# Patient Record
Sex: Female | Born: 1950
Health system: Southern US, Community
[De-identification: ages and names within clinical notes are randomized; demographics above are authoritative.]

## PROBLEM LIST (undated history)

## (undated) DIAGNOSIS — E559 Vitamin D deficiency, unspecified: Secondary | ICD-10-CM

## (undated) DIAGNOSIS — F1910 Other psychoactive substance abuse, uncomplicated: Secondary | ICD-10-CM

## (undated) DIAGNOSIS — H409 Unspecified glaucoma: Secondary | ICD-10-CM

## (undated) DIAGNOSIS — H269 Unspecified cataract: Secondary | ICD-10-CM

## (undated) DIAGNOSIS — H547 Unspecified visual loss: Secondary | ICD-10-CM

## (undated) DIAGNOSIS — T7840XA Allergy, unspecified, initial encounter: Secondary | ICD-10-CM

## (undated) DIAGNOSIS — K219 Gastro-esophageal reflux disease without esophagitis: Secondary | ICD-10-CM

## (undated) DIAGNOSIS — F418 Other specified anxiety disorders: Secondary | ICD-10-CM

## (undated) DIAGNOSIS — K5790 Diverticulosis of intestine, part unspecified, without perforation or abscess without bleeding: Secondary | ICD-10-CM

## (undated) DIAGNOSIS — F32A Depression, unspecified: Secondary | ICD-10-CM

## (undated) DIAGNOSIS — F1010 Alcohol abuse, uncomplicated: Secondary | ICD-10-CM

## (undated) DIAGNOSIS — E785 Hyperlipidemia, unspecified: Secondary | ICD-10-CM

## (undated) DIAGNOSIS — R519 Headache, unspecified: Secondary | ICD-10-CM

## (undated) HISTORY — DX: Unspecified cataract: H26.9

## (undated) HISTORY — DX: Hyperlipidemia, unspecified: E78.5

## (undated) HISTORY — DX: Diverticulosis of intestine, part unspecified, without perforation or abscess without bleeding: K57.90

## (undated) HISTORY — PX: ABDOMINAL HYSTERECTOMY: SUR658

## (undated) HISTORY — PX: ANTERIOR AND POSTERIOR VAGINAL REPAIR: SUR5

## (undated) HISTORY — DX: Unspecified visual loss: H54.7

## (undated) HISTORY — DX: Alcohol abuse, uncomplicated: F10.10

## (undated) HISTORY — DX: Other psychoactive substance abuse, uncomplicated: F19.10

## (undated) HISTORY — DX: Unspecified glaucoma: H40.9

## (undated) HISTORY — DX: Gastro-esophageal reflux disease without esophagitis: K21.9

## (undated) HISTORY — PX: EYE SURGERY: SHX253

## (undated) HISTORY — DX: Vitamin D deficiency, unspecified: E55.9

## (undated) HISTORY — PX: TUBAL LIGATION: SHX77

## (undated) HISTORY — PX: COLONOSCOPY: SHX174

## (undated) HISTORY — PX: ABDOMINAL HYSTERECTOMY: SHX81

## (undated) HISTORY — DX: Other specified anxiety disorders: F41.8

## (undated) HISTORY — DX: Allergy, unspecified, initial encounter: T78.40XA

## (undated) HISTORY — DX: Depression, unspecified: F32.A

## (undated) HISTORY — PX: HERNIA REPAIR: SHX51

## (undated) HISTORY — DX: Headache, unspecified: R51.9

---

## 2018-03-08 LAB — HM COLONOSCOPY

## 2018-08-20 DIAGNOSIS — H43811 Vitreous degeneration, right eye: Secondary | ICD-10-CM | POA: Insufficient documentation

## 2018-08-20 DIAGNOSIS — Z6823 Body mass index (BMI) 23.0-23.9, adult: Secondary | ICD-10-CM | POA: Insufficient documentation

## 2018-08-20 DIAGNOSIS — E785 Hyperlipidemia, unspecified: Secondary | ICD-10-CM | POA: Insufficient documentation

## 2018-08-20 DIAGNOSIS — E559 Vitamin D deficiency, unspecified: Secondary | ICD-10-CM | POA: Insufficient documentation

## 2019-01-03 DIAGNOSIS — R220 Localized swelling, mass and lump, head: Secondary | ICD-10-CM | POA: Insufficient documentation

## 2019-01-03 HISTORY — DX: Localized swelling, mass and lump, head: R22.0

## 2019-03-06 DIAGNOSIS — N8110 Cystocele, unspecified: Secondary | ICD-10-CM | POA: Insufficient documentation

## 2019-03-06 DIAGNOSIS — R109 Unspecified abdominal pain: Secondary | ICD-10-CM | POA: Insufficient documentation

## 2019-03-06 DIAGNOSIS — R635 Abnormal weight gain: Secondary | ICD-10-CM | POA: Insufficient documentation

## 2019-03-06 HISTORY — DX: Cystocele, unspecified: N81.10

## 2019-03-15 DIAGNOSIS — K5790 Diverticulosis of intestine, part unspecified, without perforation or abscess without bleeding: Secondary | ICD-10-CM | POA: Insufficient documentation

## 2019-04-08 DIAGNOSIS — L84 Corns and callosities: Secondary | ICD-10-CM

## 2019-04-08 DIAGNOSIS — L600 Ingrowing nail: Secondary | ICD-10-CM | POA: Insufficient documentation

## 2019-04-08 HISTORY — DX: Ingrowing nail: L60.0

## 2019-04-08 HISTORY — DX: Corns and callosities: L84

## 2019-11-25 LAB — CBC: RBC: 4.8 (ref 3.87–5.11)

## 2019-11-25 LAB — BASIC METABOLIC PANEL
BUN: 22 — AB (ref 4–21)
CO2: 23 — AB (ref 13–22)
Chloride: 106 (ref 99–108)
Creatinine: 0.7 (ref 0.5–1.1)
Glucose: 88
Potassium: 4.1 (ref 3.4–5.3)
Sodium: 141 (ref 137–147)

## 2019-11-25 LAB — CBC AND DIFFERENTIAL
HCT: 42 (ref 36–46)
Hemoglobin: 13.9 (ref 12.0–16.0)
Neutrophils Absolute: 3.3
Platelets: 229 (ref 150–399)
WBC: 6.1

## 2019-11-25 LAB — LIPID PANEL
Cholesterol: 321 — AB (ref 0–200)
HDL: 55 (ref 35–70)
LDL Cholesterol: 244
Triglycerides: 109 (ref 40–160)

## 2019-11-25 LAB — COMPREHENSIVE METABOLIC PANEL
Albumin: 4.4 (ref 3.5–5.0)
Calcium: 9.3 (ref 8.7–10.7)
GFR calc non Af Amer: 90.6

## 2020-06-09 ENCOUNTER — Encounter: Payer: Self-pay | Admitting: Family Medicine

## 2020-06-11 ENCOUNTER — Ambulatory Visit: Payer: Self-pay | Admitting: Family Medicine

## 2020-06-12 ENCOUNTER — Encounter: Payer: Self-pay | Admitting: Family Medicine

## 2020-06-12 ENCOUNTER — Other Ambulatory Visit: Payer: Self-pay

## 2020-06-12 ENCOUNTER — Ambulatory Visit (INDEPENDENT_AMBULATORY_CARE_PROVIDER_SITE_OTHER): Payer: Medicare Other | Admitting: Family Medicine

## 2020-06-12 VITALS — BP 100/63 | HR 71 | Temp 98.6°F | Ht 61.25 in | Wt 138.0 lb

## 2020-06-12 DIAGNOSIS — Z7689 Persons encountering health services in other specified circumstances: Secondary | ICD-10-CM

## 2020-06-12 DIAGNOSIS — E559 Vitamin D deficiency, unspecified: Secondary | ICD-10-CM | POA: Diagnosis not present

## 2020-06-12 DIAGNOSIS — B380 Acute pulmonary coccidioidomycosis: Secondary | ICD-10-CM | POA: Diagnosis not present

## 2020-06-12 DIAGNOSIS — Z1231 Encounter for screening mammogram for malignant neoplasm of breast: Secondary | ICD-10-CM

## 2020-06-12 DIAGNOSIS — Z23 Encounter for immunization: Secondary | ICD-10-CM | POA: Diagnosis not present

## 2020-06-12 DIAGNOSIS — E2839 Other primary ovarian failure: Secondary | ICD-10-CM

## 2020-06-12 MED ORDER — PROMETHAZINE-CODEINE 6.25-10 MG/5ML PO SYRP: 5.0000 mL | ORAL_SOLUTION | Freq: Three times a day (TID) | ORAL | 0 refills | Status: DC | PRN

## 2020-06-12 MED ORDER — ALBUTEROL SULFATE HFA 108 (90 BASE) MCG/ACT IN AERS
1.0000 | INHALATION_SPRAY | Freq: Four times a day (QID) | RESPIRATORY_TRACT | 1 refills | Status: DC | PRN
Start: 2020-06-12 — End: 2021-01-20

## 2020-06-12 NOTE — Patient Instructions (Addendum)
Pleasure to meet you today.  I have referred you to pulmonology.  I also placed your order for mammogram and bone density.   Follow up In May for your yearly physical- it was completed 11/27/2019 this year per your records.   I will refill your cough syrup for you. Future refills from pulmonology.
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
## 2020-07-22 ENCOUNTER — Telehealth: Payer: Self-pay | Admitting: Family Medicine

## 2020-07-22 NOTE — Telephone Encounter (Signed)
Please advise 

## 2020-07-22 NOTE — Telephone Encounter (Signed)
Patient still having cough from [facility] Fever and requesting refill of Phenergan w/Codeine cough syrup.

## 2020-07-23 MED ORDER — PROMETHAZINE-CODEINE 6.25-10 MG/5ML PO SYRP: 5.0000 mL | ORAL_SOLUTION | Freq: Three times a day (TID) | ORAL | 0 refills | Status: DC | PRN

## 2020-07-23 NOTE — Telephone Encounter (Signed)
Please advise, is it okay to direct pt to urgent care?

## 2020-07-23 NOTE — Telephone Encounter (Signed)
Patient following up with her request for a refill of cough meds.  She stated she has not heard back anyone. Patient reports she is very sick, and cough kept her up all night. She stated that she requested an appt to be seen yesterday, however, I could not find in telephone encounter from yesterday.  Patient asked to see her PCP, Dr. Claiborne Billings, when I told her that she did not have any appts, she became frustrated and started to cry. I told her I was sorry that she felt bad.  She felt as someone needed to listen to her lungs instead of a virtual appt.  I told patient I would send another message back to Dr. Claiborne Billings and her clinical staff and I told her I was sending back as a high priority request.  please call patient at [phone]. Thank you!

## 2020-07-23 NOTE — Telephone Encounter (Signed)
Patient advised and voiced understanding.  

## 2020-07-23 NOTE — Telephone Encounter (Signed)
I am sorry she is not feeling well.  Patient asked for a refill on a cough syrup with codeine less than 24 hours ago.   Patient should be reminded of our refill policy to ensure appropriate expectations in the future and to avoid her becoming frustrated.  I would encourage her to go to the urgent care if she is not feeling well-especially since we are going into a holiday weekend and offices will be closed.   I will refill her promethazine-codeine cough syrup for her today.  However in the future, if this medication is necessary it should really be approved by her new pulmonology team, once she is established with them.

## 2020-07-30 DIAGNOSIS — R6889 Other general symptoms and signs: Secondary | ICD-10-CM | POA: Diagnosis not present

## 2020-08-17 ENCOUNTER — Other Ambulatory Visit: Payer: Self-pay | Admitting: Family Medicine

## 2020-08-17 DIAGNOSIS — Z1231 Encounter for screening mammogram for malignant neoplasm of breast: Secondary | ICD-10-CM

## 2020-08-17 DIAGNOSIS — E559 Vitamin D deficiency, unspecified: Secondary | ICD-10-CM

## 2020-08-17 DIAGNOSIS — E2839 Other primary ovarian failure: Secondary | ICD-10-CM

## 2020-08-18 ENCOUNTER — Other Ambulatory Visit: Payer: Self-pay

## 2020-08-18 ENCOUNTER — Telehealth: Payer: Self-pay | Admitting: Family Medicine

## 2020-08-18 ENCOUNTER — Ambulatory Visit
Admission: RE | Admit: 2020-08-18 | Discharge: 2020-08-18 | Disposition: A | Discharge status: Home / Self Care | Payer: Medicare Other | Source: Ambulatory Visit | Attending: Family Medicine | Admitting: Family Medicine

## 2020-08-18 DIAGNOSIS — Z780 Asymptomatic menopausal state: Secondary | ICD-10-CM | POA: Diagnosis not present

## 2020-08-18 DIAGNOSIS — M810 Age-related osteoporosis without current pathological fracture: Secondary | ICD-10-CM | POA: Diagnosis not present

## 2020-08-18 DIAGNOSIS — E2839 Other primary ovarian failure: Secondary | ICD-10-CM

## 2020-08-18 DIAGNOSIS — B380 Acute pulmonary coccidioidomycosis: Secondary | ICD-10-CM | POA: Insufficient documentation

## 2020-08-18 DIAGNOSIS — M816 Localized osteoporosis [Lequesne]: Secondary | ICD-10-CM

## 2020-08-18 DIAGNOSIS — M85851 Other specified disorders of bone density and structure, right thigh: Secondary | ICD-10-CM | POA: Diagnosis not present

## 2020-08-18 NOTE — Telephone Encounter (Signed)
Please inform patient the following information: Her bone density scan showed osteopenia-low bone density overall.   However, her right forearm showed a rather significant decrease in bone density compared to the rest of the imaging sites and is osteoporotic in this location.  -Has she ever had any injury to her right forearm?  Sometimes we see such a large difference it is because patient had a prior injury.  We need to discuss osteoporosis treatment and also check her vitamin D levels.  Please schedule her for provider follow-up within the next 4 weeks to further discuss
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
## 2020-08-20 ENCOUNTER — Other Ambulatory Visit: Payer: Self-pay

## 2020-08-20 ENCOUNTER — Telehealth: Payer: Self-pay

## 2020-08-20 ENCOUNTER — Ambulatory Visit
Admission: RE | Admit: 2020-08-20 | Discharge: 2020-08-20 | Disposition: A | Discharge status: Home / Self Care | Payer: Medicare Other | Source: Ambulatory Visit | Attending: Family Medicine | Admitting: Family Medicine

## 2020-08-20 DIAGNOSIS — Z1231 Encounter for screening mammogram for malignant neoplasm of breast: Secondary | ICD-10-CM
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
## 2020-08-20 NOTE — Telephone Encounter (Signed)
Please read the message below. And advise if needed

## 2020-08-21 NOTE — Telephone Encounter (Signed)
Left VM for pt 

## 2020-08-21 NOTE — Telephone Encounter (Signed)
Pt sees Dr Melvyn Novas on 09/21/20. Appt was made yesterday.

## 2020-08-21 NOTE — Telephone Encounter (Signed)
Patient asking what she can do for her cough until her appointment next week.  She can use an over-the-counter cough syrup and cough drops,until we can see her to refill her cough medicine which is a controlled substance in the state of [place]. There are no other recommendations, unfortunately

## 2020-08-26 ENCOUNTER — Ambulatory Visit: Payer: Medicare Other | Admitting: Family Medicine

## 2020-08-31 DIAGNOSIS — B382 Pulmonary coccidioidomycosis, unspecified: Secondary | ICD-10-CM | POA: Diagnosis not present

## 2020-08-31 DIAGNOSIS — R911 Solitary pulmonary nodule: Secondary | ICD-10-CM | POA: Diagnosis not present

## 2020-09-21 ENCOUNTER — Institutional Professional Consult (permissible substitution): Payer: Medicare Other | Admitting: Internal Medicine

## 2020-09-29 ENCOUNTER — Ambulatory Visit: Payer: Medicare Other

## 2020-11-02 DIAGNOSIS — F1011 Alcohol abuse, in remission: Secondary | ICD-10-CM | POA: Insufficient documentation

## 2020-11-02 DIAGNOSIS — Z87891 Personal history of nicotine dependence: Secondary | ICD-10-CM | POA: Insufficient documentation

## 2020-11-30 ENCOUNTER — Encounter: Payer: Medicare Other | Admitting: Family Medicine

## 2020-12-23 ENCOUNTER — Encounter: Payer: Medicare Other | Admitting: Family Medicine

## 2020-12-28 ENCOUNTER — Telehealth: Payer: Self-pay

## 2020-12-28 DIAGNOSIS — Z20822 Contact with and (suspected) exposure to covid-19: Secondary | ICD-10-CM

## 2020-12-28 NOTE — Telephone Encounter (Signed)
Pt scheduled, 12/29/20, to have VV for covid exposure and is starting to have symptoms. Pt anted to know how she would find out if she had COVID. If you would like for pt to have swabs done tomorrow I can call her and have her come in before her 4pm appt.

## 2020-12-28 NOTE — Telephone Encounter (Signed)
Yes, please call her back and set her up for COVID-19 testing today if possible.  And we will complete her video visit tomorrow at scheduled time.  Thanks.

## 2020-12-28 NOTE — Telephone Encounter (Signed)
LM for pt to return call to discuss.  

## 2020-12-29 ENCOUNTER — Encounter: Payer: Self-pay | Admitting: Family Medicine

## 2020-12-29 ENCOUNTER — Ambulatory Visit: Payer: Medicare Other

## 2020-12-29 ENCOUNTER — Telehealth (INDEPENDENT_AMBULATORY_CARE_PROVIDER_SITE_OTHER): Payer: Medicare Other | Admitting: Family Medicine

## 2020-12-29 ENCOUNTER — Other Ambulatory Visit: Payer: Self-pay

## 2020-12-29 VITALS — Temp 99.0°F

## 2020-12-29 DIAGNOSIS — Z20822 Contact with and (suspected) exposure to covid-19: Secondary | ICD-10-CM

## 2020-12-29 DIAGNOSIS — R058 Other specified cough: Secondary | ICD-10-CM | POA: Diagnosis not present

## 2020-12-29 MED ORDER — PREDNISONE 20 MG PO TABS: ORAL_TABLET | ORAL | 0 refills | Status: DC

## 2020-12-29 NOTE — Telephone Encounter (Signed)
Order placed

## 2020-12-29 NOTE — Telephone Encounter (Signed)
Yes, please call her back and set her up for COVID-19 testing today if possible.. Any other questions will be answered at appt

## 2020-12-29 NOTE — Addendum Note (Signed)
Addended by: Octaviano Glow on: 12/29/2020 11:15 AM   Modules accepted: Orders

## 2020-12-29 NOTE — Telephone Encounter (Signed)
Are we doing just COVID or are we doing flu and strep also?

## 2020-12-29 NOTE — Telephone Encounter (Signed)
COVId only

## 2020-12-29 NOTE — Telephone Encounter (Signed)
LM for pt to return call to discuss.  

## 2020-12-29 NOTE — Progress Notes (Signed)
VIRTUAL VISIT VIA VIDEO  I connected with Crystal Horn on 12/30/20 at  4:00 PM EDT by elemedicine application and verified that I am speaking with the correct person using two identifiers. Location patient: Home Location provider: [facility], Office Persons participating in the virtual visit: Patient, Dr. Raoul Pitch and Darnell Level. Cesar, CMA  I discussed the limitations of evaluation and management by telemedicine and the availability of in person appointments. The patient expressed understanding and agreed to proceed.   SUBJECTIVE Chief Complaint  Patient presents with  . Covid Exposure    Pt c/o diarrhea, nasal congestion, cough, chills, sweats x 2 days; pt was exposed 2 weeks ago at home; pt home covid test was neg    HPI: Crystal Horn is a 70 y.o. female present for covid exposure.  Exposure: 2 weeks ago at home- her daughter was positive.  Sx start: 2 days ago Vaccine:moderna x3 (Last > 6 mos) Pt endorses diarrhea, headache, decrease appetite, cough, chills, sweats. Pt denies shortness of breath Otc: advil, albuterol GFR: >60 Anticoag: N/A She has a h/o [facility] fever. She has not needed any cough syrup  ROS: See pertinent positives and negatives per HPI.  Patient Active Problem List   Diagnosis Date Noted  . Former cigarette smoker 11/02/2020  . History of alcohol abuse 11/02/2020  . [facility] fever ([place] 08/18/2020  . Localized osteoporosis without current pathological fracture 08/18/2020  . Hyperlipidemia 08/20/2018  . Vitamin D deficiency 08/20/2018  . Vitreous degeneration and detachment of right eye 08/20/2018    Social History   Tobacco Use  . Smoking status: Former Research scientist (life sciences)  . Smokeless tobacco: Never Used  Substance Use Topics  . Alcohol use: Not Currently    Comment: former drinker    Current Outpatient Medications:  .  albuterol (VENTOLIN HFA) 108 (90 Base) MCG/ACT inhaler, Inhale 1-2 puffs into the lungs every 6 (six) hours as needed for  wheezing or shortness of breath., Disp: 6.7 each, Rfl: 1 .  LORazepam (ATIVAN) 0.5 MG tablet, Take by mouth., Disp: , Rfl:  .  omeprazole (PRILOSEC) 20 MG capsule, Take 20 mg by mouth daily., Disp: , Rfl:  .  predniSONE (DELTASONE) 20 MG tablet, 60 mg x3d, 40 mg x3d, 20 mg x2d, 10 mg x2d, Disp: 18 tablet, Rfl: 0  Allergies  Allergen Reactions  . Sulfa Antibiotics Rash    OBJECTIVE: Temp 99 F (37.2 C) (Oral)  Gen: No acute distress. Nontoxic in appearance.  HENT: AT. [place].  MMM.  Eyes:Pupils Equal Round Reactive to light, Extraocular movements intact,  Conjunctiva without redness, discharge or icterus. Chest: Cough present- no shortness of breath Neuro: Normal gait. Alert. Oriented x3  Psych: Normal affect and demeanor. Normal speech. Normal thought content and judgment.  ASSESSMENT AND PLAN: Crystal Horn is a 70 y.o. female present for  Cough with exposure to COVID-19 virus Rest, hydrate.  +/- flonase, mucinex (DM if cough), nettie pot or nasal saline.  Prednisone taper prescribed. Mucinex DM/Delsym encouraged. COVID test is pending. Self-isolation encouraged If positive COVID would prescribe paxlovid for her.  She is agreeable to this approach but would like to wait until our results are available. Emergent precautions discussed F/U 2 weeks if not improved.     Howard Pouch, DO 12/30/2020   No follow-ups on file.  No orders of the defined types were placed in this encounter.  Meds ordered this encounter  Medications  . predniSONE (DELTASONE) 20 MG tablet    Sig: 60  mg x3d, 40 mg x3d, 20 mg x2d, 10 mg x2d    Dispense:  18 tablet    Refill:  0   Referral Orders  No referral(s) requested today

## 2020-12-29 NOTE — Telephone Encounter (Signed)
Spoke with patient regarding results/recommendations. Scheduled at 1 pm

## 2020-12-29 NOTE — Telephone Encounter (Signed)
Pt states that she did a home COVID test and it was negative. Any suggestions/recommendations needed before appt?

## 2020-12-30 ENCOUNTER — Other Ambulatory Visit: Payer: Self-pay | Admitting: Family Medicine

## 2020-12-30 LAB — SARS-COV-2, NAA 2 DAY TAT

## 2020-12-30 LAB — NOVEL CORONAVIRUS, NAA: SARS-CoV-2, NAA: NOT DETECTED

## 2020-12-30 MED ORDER — AZITHROMYCIN 250 MG PO TABS: ORAL_TABLET | ORAL | 0 refills | Status: AC

## 2021-01-06 ENCOUNTER — Encounter: Payer: Medicare Other | Admitting: Family Medicine

## 2021-01-15 ENCOUNTER — Encounter: Payer: Medicare Other | Admitting: Family Medicine

## 2021-01-20 ENCOUNTER — Encounter: Payer: Self-pay | Admitting: Family Medicine

## 2021-01-20 ENCOUNTER — Other Ambulatory Visit: Payer: Self-pay

## 2021-01-20 ENCOUNTER — Ambulatory Visit (INDEPENDENT_AMBULATORY_CARE_PROVIDER_SITE_OTHER): Payer: Medicare Other | Admitting: Family Medicine

## 2021-01-20 VITALS — BP 107/69 | HR 73 | Temp 98.6°F | Ht 61.0 in | Wt 134.0 lb

## 2021-01-20 DIAGNOSIS — B380 Acute pulmonary coccidioidomycosis: Secondary | ICD-10-CM

## 2021-01-20 DIAGNOSIS — M816 Localized osteoporosis [Lequesne]: Secondary | ICD-10-CM

## 2021-01-20 DIAGNOSIS — Z1159 Encounter for screening for other viral diseases: Secondary | ICD-10-CM | POA: Diagnosis not present

## 2021-01-20 DIAGNOSIS — E782 Mixed hyperlipidemia: Secondary | ICD-10-CM

## 2021-01-20 DIAGNOSIS — K219 Gastro-esophageal reflux disease without esophagitis: Secondary | ICD-10-CM | POA: Insufficient documentation

## 2021-01-20 DIAGNOSIS — Z23 Encounter for immunization: Secondary | ICD-10-CM | POA: Diagnosis not present

## 2021-01-20 DIAGNOSIS — E559 Vitamin D deficiency, unspecified: Secondary | ICD-10-CM | POA: Diagnosis not present

## 2021-01-20 DIAGNOSIS — Z1211 Encounter for screening for malignant neoplasm of colon: Secondary | ICD-10-CM

## 2021-01-20 DIAGNOSIS — Z0000 Encounter for general adult medical examination without abnormal findings: Secondary | ICD-10-CM

## 2021-01-20 DIAGNOSIS — M810 Age-related osteoporosis without current pathological fracture: Secondary | ICD-10-CM | POA: Diagnosis not present

## 2021-01-20 DIAGNOSIS — Z131 Encounter for screening for diabetes mellitus: Secondary | ICD-10-CM | POA: Diagnosis not present

## 2021-01-20 DIAGNOSIS — F419 Anxiety disorder, unspecified: Secondary | ICD-10-CM | POA: Insufficient documentation

## 2021-01-20 LAB — COMPREHENSIVE METABOLIC PANEL WITH GFR
ALT: 10 U/L (ref 0–35)
AST: 13 U/L (ref 0–37)
Albumin: 4.3 g/dL (ref 3.5–5.2)
Alkaline Phosphatase: 65 U/L (ref 39–117)
BUN: 22 mg/dL (ref 6–23)
CO2: 26 meq/L (ref 19–32)
Calcium: 9.6 mg/dL (ref 8.4–10.5)
Chloride: 105 meq/L (ref 96–112)
Creatinine, Ser: 0.66 mg/dL (ref 0.40–1.20)
GFR: 90.34 mL/min (ref >60.00)
Glucose, Bld: 88 mg/dL (ref 70–99)
Potassium: 3.9 meq/L (ref 3.5–5.1)
Sodium: 139 meq/L (ref 135–145)
Total Bilirubin: 0.6 mg/dL (ref 0.2–1.2)
Total Protein: 6.6 g/dL (ref 6.0–8.3)

## 2021-01-20 LAB — CBC WITH DIFFERENTIAL/PLATELET
Basophils Absolute: 0 K/uL (ref 0.0–0.1)
Basophils Relative: 0.8 % (ref 0.0–3.0)
Eosinophils Absolute: 0.1 K/uL (ref 0.0–0.7)
Eosinophils Relative: 1.4 % (ref 0.0–5.0)
HCT: 39.8 % (ref 36.0–46.0)
Hemoglobin: 13.8 g/dL (ref 12.0–15.0)
Lymphocytes Relative: 29.8 % (ref 12.0–46.0)
Lymphs Abs: 1.6 K/uL (ref 0.7–4.0)
MCHC: 34.7 g/dL (ref 30.0–36.0)
MCV: 87 fl (ref 78.0–100.0)
Monocytes Absolute: 0.5 K/uL (ref 0.1–1.0)
Monocytes Relative: 8.6 % (ref 3.0–12.0)
Neutro Abs: 3.3 K/uL (ref 1.4–7.7)
Neutrophils Relative %: 59.4 % (ref 43.0–77.0)
Platelets: 188 K/uL (ref 150.0–400.0)
RBC: 4.58 Mil/uL (ref 3.87–5.11)
RDW: 13.1 % (ref 11.5–15.5)
WBC: 5.5 K/uL (ref 4.0–10.5)

## 2021-01-20 LAB — LIPID PANEL
Cholesterol: 302 mg/dL — ABNORMAL HIGH (ref 0–200)
HDL: 58.5 mg/dL (ref >39.00)
NonHDL: 243.88
Total CHOL/HDL Ratio: 5
Triglycerides: 202 mg/dL — ABNORMAL HIGH (ref 0.0–149.0)
VLDL: 40.4 mg/dL — ABNORMAL HIGH (ref 0.0–40.0)

## 2021-01-20 LAB — HEMOGLOBIN A1C: Hgb A1c MFr Bld: 6.1 % (ref 4.6–6.5)

## 2021-01-20 LAB — LDL CHOLESTEROL, DIRECT: Direct LDL: 208 mg/dL

## 2021-01-20 LAB — VITAMIN D 25 HYDROXY (VIT D DEFICIENCY, FRACTURES): VITD: 24.4 ng/mL — ABNORMAL LOW (ref 30.00–100.00)

## 2021-01-20 LAB — TSH: TSH: 1.2 u[IU]/mL (ref 0.35–4.50)

## 2021-01-20 MED ORDER — TETANUS-DIPHTH-ACELL PERTUSSIS 5-2.5-18.5 LF-MCG/0.5 IM SUSP: 0.5000 mL | Freq: Once | INTRAMUSCULAR | 0 refills | Status: AC

## 2021-01-20 MED ORDER — OMEPRAZOLE 20 MG PO CPDR: 20.0000 mg | DELAYED_RELEASE_CAPSULE | Freq: Every day | ORAL | 1 refills | Status: DC

## 2021-01-20 MED ORDER — ALENDRONATE SODIUM 70 MG PO TABS: 70.0000 mg | ORAL_TABLET | ORAL | 11 refills | Status: DC

## 2021-01-20 MED ORDER — LORAZEPAM 0.5 MG PO TABS: 0.5000 mg | ORAL_TABLET | Freq: Every day | ORAL | 1 refills | Status: DC | PRN

## 2021-01-20 MED ORDER — SHINGRIX 50 MCG/0.5ML IM SUSR: 0.5000 mL | Freq: Once | INTRAMUSCULAR | 0 refills | Status: DC

## 2021-01-20 MED ORDER — TETANUS-DIPHTH-ACELL PERTUSSIS 5-2.5-18.5 LF-MCG/0.5 IM SUSP
0.5000 mL | Freq: Once | INTRAMUSCULAR | 0 refills | Status: DC
Start: 2021-01-20 — End: 2021-01-20

## 2021-01-20 MED ORDER — SHINGRIX 50 MCG/0.5ML IM SUSR: 0.5000 mL | Freq: Once | INTRAMUSCULAR | 0 refills | Status: AC

## 2021-01-20 MED ORDER — ALBUTEROL SULFATE HFA 108 (90 BASE) MCG/ACT IN AERS: 1.0000 | INHALATION_SPRAY | Freq: Four times a day (QID) | RESPIRATORY_TRACT | 5 refills | Status: DC | PRN
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
## 2021-01-20 NOTE — Patient Instructions (Addendum)
Start fosamax 1 tab once weekly.  Calcium 1200 mg a day-   Health Maintenance After Age 68 After age 66, you are at a higher risk for certain long-term diseases and infections as well as injuries from falls. Falls are a major cause of broken bones and head injuries in people who are older than age 22. Getting regular preventive care can help to keep you healthy and well. Preventive care includes getting regular testing and making lifestyle changes as recommended by your health care provider. Talk with your health care provider about: Which screenings and tests you should have. A screening is a test that checks for a disease when you have no symptoms. A diet and exercise plan that is right for you. What should I know about screenings and tests to prevent falls? Screening and testing are the best ways to find a health problem early. Early diagnosis and treatment give you the best chance of managing medical conditions that are common after age 37. Certain conditions and lifestyle choices may make you more likely to have a fall. Your health care provider may recommend: Regular vision checks. Poor vision and conditions such as cataracts can make you more likely to have a fall. If you wear glasses, make sure to get your prescription updated if your vision changes. Medicine review. Work with your health care provider to regularly review all of the medicines you are taking, including over-the-counter medicines. Ask your health care provider about any side effects that may make you more likely to have a fall. Tell your health care provider if any medicines that you take make you feel dizzy or sleepy. Osteoporosis screening. Osteoporosis is a condition that causes the bones to get weaker. This can make the bones weak and cause them to break more easily. Blood pressure screening. Blood pressure changes and medicines to control blood pressure can make you feel dizzy. Strength and balance checks. Your health care  provider may recommend certain tests to check your strength and balance while standing, walking, or changing positions. Foot health exam. Foot pain and numbness, as well as not wearing proper footwear, can make you more likely to have a fall. Depression screening. You may be more likely to have a fall if you have a fear of falling, feel emotionally low, or feel unable to do activities that you used to do. Alcohol use screening. Using too much alcohol can affect your balance and may make you more likely to have a fall. What actions can I take to lower my risk of falls? General instructions Talk with your health care provider about your risks for falling. Tell your health care provider if: You fall. Be sure to tell your health care provider about all falls, even ones that seem minor. You feel dizzy, sleepy, or off-balance. Take over-the-counter and prescription medicines only as told by your health care provider. These include any supplements. Eat a healthy diet and maintain a healthy weight. A healthy diet includes low-fat dairy products, low-fat (lean) meats, and fiber from whole grains, beans, and lots of fruits and vegetables. Home safety Remove any tripping hazards, such as rugs, cords, and clutter. Install safety equipment such as grab bars in bathrooms and safety rails on stairs. Keep rooms and walkways well-lit. Activity  Follow a regular exercise program to stay fit. This will help you maintain your balance. Ask your health care provider what types of exercise are appropriate for you. If you need a cane or walker, use it as recommended by  your health care provider. Wear supportive shoes that have nonskid soles.  Lifestyle Do not drink alcohol if your health care provider tells you not to drink. If you drink alcohol, limit how much you have: 0-1 drink a day for women. 0-2 drinks a day for men. Be aware of how much alcohol is in your drink. In the [place] one drink equals one typical  bottle of beer (12 oz), one-half glass of wine (5 oz), or one shot of hard liquor (1 oz). Do not use any products that contain nicotine or tobacco, such as cigarettes and e-cigarettes. If you need help quitting, ask your health care provider. Summary Having a healthy lifestyle and getting preventive care can help to protect your health and wellness after age 24. Screening and testing are the best way to find a health problem early and help you avoid having a fall. Early diagnosis and treatment give you the best chance for managing medical conditions that are more common for people who are older than age 29. Falls are a major cause of broken bones and head injuries in people who are older than age 69. Take precautions to prevent a fall at home. Work with your health care provider to learn what changes you can make to improve your health and wellness and to prevent falls. This information is not intended to replace advice given to you by your health care provider. Make sure you discuss any questions you have with your healthcare provider. Document Revised: 06/26/2020 Document Reviewed: 06/26/2020 Elsevier Patient Education  2022 [employer].

## 2021-01-21 ENCOUNTER — Telehealth: Payer: Self-pay | Admitting: Family Medicine

## 2021-01-21 DIAGNOSIS — Z79899 Other long term (current) drug therapy: Secondary | ICD-10-CM

## 2021-01-21 DIAGNOSIS — Z8249 Family history of ischemic heart disease and other diseases of the circulatory system: Secondary | ICD-10-CM

## 2021-01-21 DIAGNOSIS — R7303 Prediabetes: Secondary | ICD-10-CM

## 2021-01-21 HISTORY — DX: Prediabetes: R73.03

## 2021-01-21 LAB — HEPATITIS C ANTIBODY
Hepatitis C Ab: NONREACTIVE
SIGNAL TO CUT-OFF: 0 (ref <1.00)

## 2021-01-21 MED ORDER — ATORVASTATIN CALCIUM 20 MG PO TABS: 20.0000 mg | ORAL_TABLET | Freq: Every evening | ORAL | 3 refills | Status: DC

## 2021-01-21 NOTE — Telephone Encounter (Signed)
Please call patient Liver, kidney and thyroid function are normal Blood cell counts and electrolytes are normal Diabetes screening/A1c is elevated in the "prediabetic "range at 6.1.  See recommendations below Vitamin D is mildly low, I would encourage her to increase her vitamin D supplement by 1000 units daily OTC. Cholesterol panel is extremely high.   -Elevated triglycerides of 202, normal is less than 150   -Elevated LDL/bad cholesterol to 208, goal is at least less than 130. -This puts her at a severely higher risk of heart attack or stroke than the normal population-especially with her family history of heart disease.   -Recommendations from the [facility] is that she start a medication to help lower her cholesterol and also provide her extra cardiovascular protection from heart attack and stroke.  First-line therapy is a medication in the statin family group.  I have called this medicine in for her to start 1 tab before bed.   -Follow-up in 2 months with provider.  Come fasting to that appointment.  We will be rechecking her cholesterol and we also always check the liver enzymes after starting this medication.  She should also increase her exercise to be routinely at least 150 minutes a week of cardiovascular exercise, along with a low saturated fat diet.  Avoiding butters, red meats, fatty or cuts of meats, sugary snacks/drinks and saturated fats.  Diet should have higher fiber content, fresh fruits vegetables and lean meats.

## 2021-01-21 NOTE — Telephone Encounter (Signed)
Spoke with pt regarding labs and instructions.   

## 2021-02-02 ENCOUNTER — Telehealth (INDEPENDENT_AMBULATORY_CARE_PROVIDER_SITE_OTHER): Payer: Medicare Other | Admitting: Family Medicine

## 2021-02-02 ENCOUNTER — Encounter: Payer: Self-pay | Admitting: Family Medicine

## 2021-02-02 ENCOUNTER — Telehealth: Payer: Self-pay

## 2021-02-02 DIAGNOSIS — R059 Cough, unspecified: Secondary | ICD-10-CM

## 2021-02-02 DIAGNOSIS — U071 COVID-19: Secondary | ICD-10-CM | POA: Diagnosis not present

## 2021-02-02 DIAGNOSIS — B380 Acute pulmonary coccidioidomycosis: Secondary | ICD-10-CM

## 2021-02-02 MED ORDER — NIRMATRELVIR/RITONAVIR (PAXLOVID)TABLET: 3.0000 | ORAL_TABLET | Freq: Two times a day (BID) | ORAL | 0 refills | Status: AC

## 2021-02-02 MED ORDER — HYDROCODONE BIT-HOMATROP MBR 5-1.5 MG/5ML PO SOLN: 5.0000 mL | Freq: Three times a day (TID) | ORAL | 0 refills | Status: DC | PRN

## 2021-02-02 MED ORDER — PREDNISONE 20 MG PO TABS: ORAL_TABLET | ORAL | 0 refills | Status: DC

## 2021-02-02 NOTE — Progress Notes (Signed)
VIRTUAL VISIT VIA VIDEO  I connected with Crystal Horn on 02/02/21 at 11:30 AM EDT by elemedicine application and verified that I am speaking with the correct person using two identifiers. Location patient: Home Location provider: [facility], Office Persons participating in the virtual visit: Patient, Dr. Raoul Pitch and Darnell Level. Cesar, CMA  I discussed the limitations of evaluation and management by telemedicine and the availability of in person appointments. The patient expressed understanding and agreed to proceed.   SUBJECTIVE Chief Complaint  Patient presents with   Covid Positive    Pt c/o cough, sore throat, nasal congestion, HA, fatigue, eye drainage, low grade temp 99.5 x 1 day; pt home covid pos last night    HPI: Crystal Horn is a 70 y.o. female present for covid + illness.  Exposure:Family Reunion over the weekend.  Sx start: x1 day of cough, sore throat, nasal congestion, headache, fatigue, drainage from her eyes and low grade fever. Positive test yesterday.  Vaccine:Moderna x3 Pt denies shortness breath,  Otc: advil.  GFR: >90 Anticoag: N/a   ROS: See pertinent positives and negatives per HPI.  Patient Active Problem List   Diagnosis Date Noted   Family history of heart disease 01/21/2021   Prediabetes 01/21/2021   GERD (gastroesophageal reflux disease) 01/20/2021   Osteoporosis without current pathological fracture 01/20/2021   Anxiety 01/20/2021   Former cigarette smoker 11/02/2020   History of alcohol abuse 11/02/2020   [facility] fever [facility]) 08/18/2020   Localized osteoporosis without current pathological fracture 08/18/2020   Hyperlipidemia 08/20/2018   Vitamin D deficiency 08/20/2018   Vitreous degeneration and detachment of right eye 08/20/2018    Social History   Tobacco Use   Smoking status: Former    Pack years: 0.00   Smokeless tobacco: Never  Substance Use Topics   Alcohol use: Not Currently    Comment: former drinker     Current Outpatient Medications:    albuterol (VENTOLIN HFA) 108 (90 Base) MCG/ACT inhaler, Inhale 1-2 puffs into the lungs every 6 (six) hours as needed for wheezing or shortness of breath., Disp: 6.7 each, Rfl: 5   alendronate (FOSAMAX) 70 MG tablet, Take 1 tablet (70 mg total) by mouth every 7 (seven) days. Take with a full glass of water on an empty stomach., Disp: 4 tablet, Rfl: 11   atorvastatin (LIPITOR) 20 MG tablet, Take 1 tablet (20 mg total) by mouth every evening., Disp: 90 tablet, Rfl: 3   HYDROcodone bit-homatropine (HYCODAN) 5-1.5 MG/5ML syrup, Take 5 mLs by mouth every 8 (eight) hours as needed for cough., Disp: 120 mL, Rfl: 0   LORazepam (ATIVAN) 0.5 MG tablet, Take 1 tablet (0.5 mg total) by mouth daily as needed for anxiety., Disp: 90 tablet, Rfl: 1   nirmatrelvir/ritonavir EUA (PAXLOVID) TABS, Take 3 tablets by mouth 2 (two) times daily for 5 days. (Take nirmatrelvir 150 mg two tablets twice daily for 5 days and ritonavir 100 mg one tablet twice daily for 5 days) Patient GFR is >90, Disp: 30 tablet, Rfl: 0   omeprazole (PRILOSEC) 20 MG capsule, Take 1 capsule (20 mg total) by mouth daily., Disp: 90 capsule, Rfl: 1   predniSONE (DELTASONE) 20 MG tablet, 60 mg x3d, 40 mg x3d, 20 mg x2d, 10 mg x2d, Disp: 18 tablet, Rfl: 0  Allergies  Allergen Reactions   Sulfa Antibiotics Rash    OBJECTIVE: There were no vitals taken for this visit. Gen: No acute distress. Nontoxic in appearance.  HENT: AT. [place].  MMM.  Eyes:Pupils Equal Round Reactive to light, Extraocular movements intact,  Conjunctiva without redness, discharge or icterus. Chest: Cough present Skin: no rashes, purpura or petechiae.  Neuro: Alert. Oriented x3  Psych: Normal affect and demeanor. Normal speech. Normal thought content and judgment.  ASSESSMENT AND PLAN: Crystal Horn is a 70 y.o. female present for  COVID-19/cough/[facility] fever ([place]) Rest, hydrate.  +/- flonase, mucinex (DM if cough), nettie  pot or nasal saline.  Paxlovid and prednisone taper prescribed, take until completed.  Hycodan prescribed for cough.  Reviewed home care instructions for COVID. Advised self-isolation at home for at least 5 days. After 5 days, if improved and fever resolved, can be in public, but should wear a mask around others for an additional 5 days. If symptoms, esp, dyspnea develops/worsens, recommend in-person evaluation at either an urgent care or the emergency room.   Howard Pouch, DO 02/02/2021   No follow-ups on file.  No orders of the defined types were placed in this encounter.  Meds ordered this encounter  Medications   nirmatrelvir/ritonavir EUA (PAXLOVID) TABS    Sig: Take 3 tablets by mouth 2 (two) times daily for 5 days. (Take nirmatrelvir 150 mg two tablets twice daily for 5 days and ritonavir 100 mg one tablet twice daily for 5 days) Patient GFR is >90    Dispense:  30 tablet    Refill:  0   predniSONE (DELTASONE) 20 MG tablet    Sig: 60 mg x3d, 40 mg x3d, 20 mg x2d, 10 mg x2d    Dispense:  18 tablet    Refill:  0   HYDROcodone bit-homatropine (HYCODAN) 5-1.5 MG/5ML syrup    Sig: Take 5 mLs by mouth every 8 (eight) hours as needed for cough.    Dispense:  120 mL    Refill:  0   Referral Orders  No referral(s) requested today

## 2021-02-02 NOTE — Telephone Encounter (Signed)
Pt had appt with PCP   [place] RECORD AccessNurse Patient Name: Crystal Horn [facility] Gender: Female DOB: Jun 08, 1951 Age: 70 Y 37 M 17 D Return Phone Number: [phone] (Primary) Address: [place]/ [place]/ [place] [address] Client [facility] - Client Client Site [place] - Day Physician [facility] - PHYSICIAN, Verita Schneiders- MD Contact Type Call Who Is Calling Patient / Member / Family / Caregiver Call Type Triage / Clinical Relationship To Patient Self Return Phone Number [phone] (Primary) Chief Complaint Headache Reason for Call Symptomatic / Request for Health Information Initial Comment Caller tested Covid + yesterday at home and asks about the Covid Rx. Temp 99.6, bad headache, congestion, fatigue, dry cough. Translation No Nurse Assessment Nurse: Acey Lav, RN, Estill Bamberg Date/Time (Eastern Time): 02/02/2021 8:17:05 AM Confirm and document reason for call. If symptomatic, describe symptoms. ---Caller tested Covid + yesterday at home and asks about the Covid Rx-antiviral. Temp 99.6 F (oral), bad HA, congestion, fatigue, dry cough. S/S started yesterday morning. Does the patient have any new or worsening symptoms? ---Yes Will a triage be completed? ---Yes Related visit to physician within the last 2 weeks? ---No Does the PT have any chronic conditions? (i.e. diabetes, asthma, this includes High risk factors for pregnancy, etc.) ---Yes List chronic conditions. ---Lung issues per caller from "[place]" fever per caller Is this a behavioral health or substance abuse call? ---No Guidelines Guideline Title Affirmed Question Affirmed Notes Nurse Date/Time (Eastern Time) COVID-19 - Diagnosed or Suspected [1] HIGH RISK for severe COVID complications (e.g., weak immune system, age > 53 years, obesity with BMI > 29, pregnant, chronic lung disease or other Acey Lav, RNEstill Bamberg  02/02/2021 8:18:34 AM PLEASE NOTE: All timestamps contained within this report are represented as [place] Standard Time. CONFIDENTIALTY NOTICE: This fax transmission is intended only for the addressee. It contains information that is legally privileged, confidential or otherwise protected from use or disclosure. If you are not the intended recipient, you are strictly prohibited from reviewing, disclosing, copying using or disseminating any of this information or taking any action in reliance on or regarding this information. If you have received this fax in error, please notify us immediately by telephone so that we can arrange for its return to [place]. Phone: [phone], Toll-Free: [phone], Fax: [phone] Page: 2 of 2 Call Id: [id number] Guidelines Guideline Title Affirmed Question Affirmed Notes Nurse Date/Time Eilene Ghazi Time) chronic medical condition) AND [2] COVID symptoms (e.g., cough, fever) (Exceptions: Already seen by PCP and no new or worsening symptoms.) Disp. Time Eilene Ghazi Time) Disposition Final User 02/02/2021 8:25:07 AM See HCP within 4 Hours (or PCP triage) Yes Acey Lav, RN, Estill Bamberg Disposition Overriden: Call PCP Now Override Reason: Specify reason. (Please document in 'advice recommended' section) Caller Disagree/Comply Comply Caller Understands Yes PreDisposition Call Doctor Care Advice Given Per Guideline CALL PCP NOW: * You need to discuss this with your doctor (or NP/PA). COUGH MEDICINES: * COUGH DROPS: Over-thecounter cough drops can help a lot, especially for mild coughs. They soothe an irritated throat and remove the tickle sensation in the back of the throat. Cough drops are easy to carry with you. * HOME REMEDY - HARD CANDY: Hard candy works just as well as over-the-counter cough drops. People who have diabetes should use sugar-free candy. * HOME REMEDY - HONEY: This old home remedy has been shown to help decrease coughing at night. The adult dosage is 2  teaspoons (10 ml) at  bedtime. CARE ADVICE given per COVID-19 - DIAGNOSED OR SUSPECTED (Adult) guideline. CALL BACK IF: * You become worse Comments User: Karl Ito, RN Date/Time Eilene Ghazi Time): 02/02/2021 8:24:17 AM high risk covid patient. no answer after multiple attempt to backline, will tx called back to main office number for appt. Referrals REFERRED TO PCP OFFICE

## 2021-02-02 NOTE — Patient Instructions (Signed)
10 Things You Can Do to Manage Your COVID-19 Symptoms at Home If you have possible or confirmed COVID-19 Stay home except to get medical care. Monitor your symptoms carefully. If your symptoms get worse, call your healthcare provider immediately. Get rest and stay hydrated. If you have a medical appointment, call the healthcare provider ahead of time and tell them that you have or may have COVID-19. For medical emergencies, call 911 and notify the dispatch personnel that you have or may have COVID-19. Cover your cough and sneezes with a tissue or use the inside of your elbow. Wash your hands often with soap and water for at least 20 seconds or clean your hands with an alcohol-based hand sanitizer that contains at least 60% alcohol. As much as possible, stay in a specific room and away from other people in your home. Also, you should use a separate bathroom, if available. If you need to be around other people in or outside of the home, wear a mask. Avoid sharing personal items with other people in your household, like dishes, towels, and bedding. Clean all surfaces that are touched often, like counters, tabletops, and doorknobs. Use household cleaning sprays or wipes according to the label instructions. cdc.gov/coronavirus 02/07/2020 This information is not intended to replace advice given to you by your health care provider. Make sure you discuss any questions you have with your healthcare provider. Document Revised: 08/28/2020 Document Reviewed: 08/28/2020 Elsevier Patient Education  2022 Elsevier Inc.  

## 2021-02-10 ENCOUNTER — Telehealth: Payer: Self-pay | Admitting: Family Medicine

## 2021-02-10 NOTE — Telephone Encounter (Signed)
Patient states she completed prescribed round of Paxlovid but still has cough and still tested positive for Covid today. She would like to know if this is normal and/or how she should treat now. Please call patient to advise.

## 2021-02-10 NOTE — Telephone Encounter (Signed)
Spoke with pt regarding cough. Pt denied any SOB or wheezing. Pt states her sx has been improving until today. Pt was informed to go to ER overnight if sx worse in any way and to cb tomorrow to report her sx at that time. If pt sx have not improved she will need an VV with any provider, pt aware that Dr. Raoul Pitch will be out of the office tomorrow and verbalized understanding.   Pt was informed to quarantine for 5 days but must have 3 consecutive days of improving to end quarantine.

## 2021-03-18 ENCOUNTER — Ambulatory Visit: Payer: Medicare Other | Admitting: Gastroenterology

## 2021-04-12 ENCOUNTER — Telehealth: Payer: Self-pay

## 2021-04-12 ENCOUNTER — Ambulatory Visit: Payer: Medicare Other | Admitting: Family Medicine

## 2021-04-12 NOTE — Telephone Encounter (Signed)
LVM for pt to CB to sched AWV

## 2021-04-16 ENCOUNTER — Encounter: Payer: Self-pay | Admitting: Family Medicine

## 2021-04-16 ENCOUNTER — Ambulatory Visit (INDEPENDENT_AMBULATORY_CARE_PROVIDER_SITE_OTHER): Payer: Medicare Other | Admitting: Family Medicine

## 2021-04-16 ENCOUNTER — Other Ambulatory Visit: Payer: Self-pay

## 2021-04-16 VITALS — BP 96/59 | HR 62 | Temp 97.9°F | Ht 61.0 in | Wt 136.0 lb

## 2021-04-16 DIAGNOSIS — E782 Mixed hyperlipidemia: Secondary | ICD-10-CM

## 2021-04-16 DIAGNOSIS — E559 Vitamin D deficiency, unspecified: Secondary | ICD-10-CM

## 2021-04-16 DIAGNOSIS — Z23 Encounter for immunization: Secondary | ICD-10-CM

## 2021-04-16 DIAGNOSIS — Z79899 Other long term (current) drug therapy: Secondary | ICD-10-CM | POA: Diagnosis not present

## 2021-04-16 LAB — HEPATIC FUNCTION PANEL
ALT: 11 U/L (ref 0–35)
AST: 15 U/L (ref 0–37)
Albumin: 4.3 g/dL (ref 3.5–5.2)
Alkaline Phosphatase: 67 U/L (ref 39–117)
Bilirubin, Direct: 0.1 mg/dL (ref 0.0–0.3)
Total Bilirubin: 0.5 mg/dL (ref 0.2–1.2)
Total Protein: 6.6 g/dL (ref 6.0–8.3)

## 2021-04-16 LAB — VITAMIN D 25 HYDROXY (VIT D DEFICIENCY, FRACTURES): VITD: 36.15 ng/mL (ref 30.00–100.00)

## 2021-04-16 LAB — LIPID PANEL
Cholesterol: 199 mg/dL (ref 0–200)
HDL: 62.3 mg/dL (ref >39.00)
LDL Cholesterol: 119 mg/dL — ABNORMAL HIGH (ref 0–99)
NonHDL: 136.74
Total CHOL/HDL Ratio: 3
Triglycerides: 87 mg/dL (ref 0.0–149.0)
VLDL: 17.4 mg/dL (ref 0.0–40.0)

## 2021-04-16 MED ORDER — OMEPRAZOLE 20 MG PO CPDR: 20.0000 mg | DELAYED_RELEASE_CAPSULE | Freq: Every day | ORAL | 1 refills | Status: DC

## 2021-04-16 MED ORDER — LORAZEPAM 0.5 MG PO TABS: 0.5000 mg | ORAL_TABLET | Freq: Every day | ORAL | 1 refills | Status: AC | PRN
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
## 2021-04-16 NOTE — Addendum Note (Signed)
Addended by: Howard Pouch A on: 04/16/2021 12:23 PM   Modules accepted: Orders

## 2021-04-16 NOTE — Patient Instructions (Signed)
Great to see you today.  I have refilled the medication(s) we provide.   If labs were collected, we will inform you of lab results once received either by echart message or telephone call.   - echart message- for normal results that have been seen by the patient already.   - telephone call: abnormal results or if patient has not viewed results in their echart.   High Cholesterol High cholesterol is a condition in which the blood has high levels of a white, waxy substance similar to fat (cholesterol). The liver makes all the cholesterol that the body needs. The human body needs small amounts of cholesterol to help build cells. A person gets extra or excess cholesterol from the food that he or she eats. The blood carries cholesterol from the liver to the rest of the body. If you have high cholesterol, deposits (plaques) may build up on the walls of your arteries. Arteries are the blood vessels that carry blood away from your heart. These plaques make the arteries narrow and stiff. Cholesterol plaques increase your risk for heart attack and stroke. Work with your health care provider to keep your cholesterol levels in a healthy range. What increases the risk? The following factors may make you more likely to develop this condition: Eating foods that are high in animal fat (saturated fat) or cholesterol. Being overweight. Not getting enough exercise. A family history of high cholesterol (familial hypercholesterolemia). Use of tobacco products. Having diabetes. What are the signs or symptoms? In most cases, high cholesterol does not usually cause any symptoms. In severe cases, very high cholesterol levels can cause: Fatty bumps under the skin (xanthomas). A white or gray ring around the black center (pupil) of the eye. How is this diagnosed? This condition may be diagnosed based on the results of a blood test. If you are older than 67 years of age, your health care provider may check your  cholesterol levels every 4-6 years. You may be checked more often if you have high cholesterol or other risk factors for heart disease. The blood test for cholesterol measures: "Bad" cholesterol, or LDL cholesterol. This is the main type of cholesterol that causes heart disease. The desired level is less than 100 mg/dL (2.59 mmol/L). "Good" cholesterol, or HDL cholesterol. HDL helps protect against heart disease by cleaning the arteries and carrying the LDL to the liver for processing. The desired level for HDL is 60 mg/dL (1.55 mmol/L) or higher. Triglycerides. These are fats that your body can store or burn for energy. The desired level is less than 150 mg/dL (1.69 mmol/L). Total cholesterol. This measures the total amount of cholesterol in your blood and includes LDL, HDL, and triglycerides. The desired level is less than 200 mg/dL (5.17 mmol/L). How is this treated? Treatment for high cholesterol starts with lifestyle changes, such as diet and exercise. Diet changes. You may be asked to eat foods that have more fiber and less saturated fats or added sugar. Lifestyle changes. These may include regular exercise, maintaining a healthy weight, and quitting use of tobacco products. Medicines. These are given when diet and lifestyle changes have not worked. You may be prescribed a statin medicine to help lower your cholesterol levels. Follow these instructions at home: Eating and drinking  Eat a healthy, balanced diet. This diet includes: Daily servings of a variety of fresh, frozen, or canned fruits and vegetables. Daily servings of whole grain foods that are rich in fiber. Foods that are low in saturated fats  and trans fats. These include poultry and fish without skin, lean cuts of meat, and low-fat dairy products. A variety of fish, especially oily fish that contain omega-3 fatty acids. Aim to eat fish at least 2 times a week. Avoid foods and drinks that have added sugar. Use healthy cooking  methods, such as roasting, grilling, broiling, baking, poaching, steaming, and stir-frying. Do not fry your food except for stir-frying. If you drink alcohol: Limit how much you have to: 0-1 drink a day for women who are not pregnant. 0-2 drinks a day for men. Know how much alcohol is in a drink. In the [place] one drink equals one 12 oz bottle of beer (355 mL), one 5 oz glass of wine (148 mL), or one 1 oz glass of hard liquor (44 mL). Lifestyle  Get regular exercise. Aim to exercise for a total of 150 minutes a week. Increase your activity level by doing activities such as gardening, walking, and taking the stairs. Do not use any products that contain nicotine or tobacco. These products include cigarettes, chewing tobacco, and vaping devices, such as e-cigarettes. If you need help quitting, ask your health care provider. General instructions Take over-the-counter and prescription medicines only as told by your health care provider. Keep all follow-up visits. This is important. Where to find more information American Heart Association: www.heart.org National Heart, Lung, and Blood Institute: https://wilson-eaton.com/ Contact a health care provider if: You have trouble achieving or maintaining a healthy diet or weight. You are starting an exercise program. You are unable to stop smoking. Get help right away if: You have chest pain. You have trouble breathing. You have discomfort or pain in your jaw, neck, back, shoulder, or arm. You have any symptoms of a stroke. "BE FAST" is an easy way to remember the main warning signs of a stroke: B - Balance. Signs are dizziness, sudden trouble walking, or loss of balance. E - Eyes. Signs are trouble seeing or a sudden change in vision. F - Face. Signs are sudden weakness or numbness of the face, or the face or eyelid drooping on one side. A - Arms. Signs are weakness or numbness in an arm. This happens suddenly and usually on one side of the body. S -  Speech. Signs are sudden trouble speaking, slurred speech, or trouble understanding what people say. T - Time. Time to call emergency services. Write down what time symptoms started. You have other signs of a stroke, such as: A sudden, severe headache with no known cause. Nausea or vomiting. Seizure. These symptoms may represent a serious problem that is an emergency. Do not wait to see if the symptoms will go away. Get medical help right away. Call your local emergency services (911 in the [place] Do not drive yourself to the hospital. Summary Cholesterol plaques increase your risk for heart attack and stroke. Work with your health care provider to keep your cholesterol levels in a healthy range. Eat a healthy, balanced diet, get regular exercise, and maintain a healthy weight. Do not use any products that contain nicotine or tobacco. These products include cigarettes, chewing tobacco, and vaping devices, such as e-cigarettes. Get help right away if you have any symptoms of a stroke. This information is not intended to replace advice given to you by your health care provider. Make sure you discuss any questions you have with your health care provider. Document Revised: 09/24/2020 Document Reviewed: 09/14/2020 Elsevier Patient Education  2022 [employer].

## 2021-05-05 ENCOUNTER — Encounter: Payer: Self-pay | Admitting: Family Medicine

## 2021-05-05 ENCOUNTER — Other Ambulatory Visit: Payer: Self-pay

## 2021-05-05 ENCOUNTER — Ambulatory Visit (INDEPENDENT_AMBULATORY_CARE_PROVIDER_SITE_OTHER): Payer: Medicare Other | Admitting: Family Medicine

## 2021-05-05 VITALS — BP 117/78 | HR 71 | Temp 98.0°F | Wt 136.8 lb

## 2021-05-05 DIAGNOSIS — A084 Viral intestinal infection, unspecified: Secondary | ICD-10-CM | POA: Diagnosis not present

## 2021-05-05 DIAGNOSIS — R197 Diarrhea, unspecified: Secondary | ICD-10-CM

## 2021-05-05 MED ORDER — ONDANSETRON HCL 4 MG PO TABS: 4.0000 mg | ORAL_TABLET | Freq: Three times a day (TID) | ORAL | 0 refills | Status: DC | PRN
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
## 2021-05-05 NOTE — Patient Instructions (Signed)
Try Florastor or Align probiotic daily (both over the counter). This can help replace the "good bacteria" in the colon lining.  Drink Gatorade zero and water - to replace electrolytes and  correct dehydration.   Eat a gentle bland diet for a few days.    Viral Gastroenteritis, Adult Viral gastroenteritis is also known as the stomach flu. This condition may affect your stomach, your small intestine, and your large intestine. It can cause sudden watery poop (diarrhea), fever, and throwing up (vomiting). This condition is caused by certain germs (viruses). These germs can be passed from person to person very easily (are contagious). Having watery poop and throwing up can make you feel weak and cause you to not have enough water in your body (get dehydrated). This can make you tired and thirsty, make you have a dry mouth, and make it so you pee (urinate) less often. It is important to replace the fluids that you lose from having watery poop and throwing up. What are the causes? You can get sick by catching viruses from other people. You can also get sick by: Eating food, drinking water, or touching a surface that has the viruses on it (is contaminated). Sharing utensils or other personal items with a person who is sick. What increases the risk? Having a weak body defense system (immune system). Living with one or more children who are younger than 33 years old. Living in a nursing home. Going on cruise ships. What are the signs or symptoms? Symptoms of this condition start suddenly. Symptoms may last for a few days or for as long as a week. Common symptoms include: Watery poop. Throwing up. Other symptoms include: Fever. Headache. Feeling tired (fatigue). Pain in the belly (abdomen). Chills. Feeling weak. Feeling sick to your stomach (nauseous). Muscle aches. Not feeling hungry. How is this treated? This condition typically goes away on its own. The focus of treatment is to  replace the fluids that you lose. This condition may be treated with: An ORS (oral rehydration solution). This is a drink that is sold at pharmacies and stores. Medicines to help with your symptoms. Probiotic supplements to reduce symptoms of diarrhea. Fluids given through an IV tube, if needed. Older adults and people with other diseases or a weak body defense system are at higher risk for not having enough water in the body. Follow these instructions at home: Eating and drinking  Take an ORS as told by your doctor. Drink clear fluids in small amounts as you are able. Clear fluids include: Water. Ice chips. Fruit juice with water added to it (diluted). Low-calorie sports drinks. Drink enough fluid to keep your pee (urine) pale yellow. Eat small amounts of healthy foods every 3-4 hours as you are able. This may include whole grains, fruits, vegetables, lean meats, and yogurt. Avoid fluids that have a lot of sugar or caffeine in them, such as energy drinks, sports drinks, and soda. Avoid spicy or fatty foods. Avoid alcohol. General instructions  Wash your hands often. This is very important after you have watery poop or you throw up. If you cannot use soap and water, use hand sanitizer. Make sure that all people in your home wash their hands well and often. Take over-the-counter and prescription medicines only as told by your doctor. Rest at home while you get better. Watch your condition for any changes. Take a warm bath to help with any burning or pain from having watery poop. Keep all follow-up visits as  told by your doctor. This is important. Contact a doctor if: You cannot keep fluids down. Your symptoms get worse. You have new symptoms. You feel light-headed. You feel dizzy. You have muscle cramps. Get help right away if: You have chest pain. You feel very weak. You pass out (faint). You see blood in your throw-up. Your throw-up looks like coffee grounds. You have  bloody or black poop (stools) or poop that looks like tar. You have a very bad headache, or a stiff neck, or both. You have a rash. You have very bad pain, cramping, or bloating in your belly. You have trouble breathing. You are breathing very quickly. You have a fast heartbeat. Your skin feels cold and clammy. You feel mixed up (confused). You have pain when you pee. You have signs of not having enough water in the body, such as: Dark pee, hardly any pee, or no pee. Cracked lips. Dry mouth. Sunken eyes. Feeling very sleepy. Feeling weak. Summary Viral gastroenteritis is also known as the stomach flu. This condition can cause sudden watery poop (diarrhea), fever, and throwing up (vomiting). These germs can be passed from person to person very easily. Take an ORS as told by your doctor. This is a drink that is sold at pharmacies and stores. Drink fluids in small amounts many times each day as you are able. This information is not intended to replace advice given to you by your health care provider. Make sure you discuss any questions you have with your health care provider. Document Revised: 05/16/2018 Document Reviewed: 05/16/2018 Elsevier Patient Education  2022 [employer].

## 2021-05-12 ENCOUNTER — Ambulatory Visit: Payer: Medicare Other

## 2021-06-23 ENCOUNTER — Ambulatory Visit (INDEPENDENT_AMBULATORY_CARE_PROVIDER_SITE_OTHER): Payer: Medicare Other

## 2021-06-23 ENCOUNTER — Ambulatory Visit: Payer: Medicare Other

## 2021-06-23 ENCOUNTER — Other Ambulatory Visit: Payer: Self-pay

## 2021-06-23 DIAGNOSIS — Z0000 Encounter for general adult medical examination without abnormal findings: Secondary | ICD-10-CM
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
## 2021-06-23 NOTE — Patient Instructions (Addendum)
Ms. Crystal Horn , Thank you for taking time to come for your Medicare Wellness Visit. I appreciate your ongoing commitment to your health goals. Please review the following plan we discussed and let me know if I can assist you in the future.   Screening recommendations/referrals: Colonoscopy: Done 07/25/17 repeat every 10 years Mammogram: Done 08/20/20 repeat every year Bone Density: Done 08/18/20 repeat every 2 years  Recommended yearly ophthalmology/optometry visit for glaucoma screening and checkup Recommended yearly dental visit for hygiene and checkup  Vaccinations: Influenza vaccine: Done 04/16/21 repeat every year Pneumococcal vaccine: Up to date Tdap vaccine: Due and discussed Shingles vaccine: Shingrix discussed. Please contact your pharmacy for coverage information.    Covid-19:Completed 3/3, 3/31, 05/20/20  Advanced directives: Advance directive discussed with you today. I have provided a copy for you to complete at home and have notarized. Once this is complete please bring a copy in to our office so we can scan it into your chart.  Conditions/risks identified: Lose weight   Next appointment: Follow up in one year for your annual wellness visit    Preventive Care 65 Years and Older, Female Preventive care refers to lifestyle choices and visits with your health care provider that can promote health and wellness. What does preventive care include? A yearly physical exam. This is also called an annual well check. Dental exams once or twice a year. Routine eye exams. Ask your health care provider how often you should have your eyes checked. Personal lifestyle choices, including: Daily care of your teeth and gums. Regular physical activity. Eating a healthy diet. Avoiding tobacco and drug use. Limiting alcohol use. Practicing safe sex. Taking low-dose aspirin every day. Taking vitamin and mineral supplements as recommended by your health care provider. What happens during an  annual well check? The services and screenings done by your health care provider during your annual well check will depend on your age, overall health, lifestyle risk factors, and family history of disease. Counseling  Your health care provider may ask you questions about your: Alcohol use. Tobacco use. Drug use. Emotional well-being. Home and relationship well-being. Sexual activity. Eating habits. History of falls. Memory and ability to understand (cognition). Work and work Statistician. Reproductive health. Screening  You may have the following tests or measurements: Height, weight, and BMI. Blood pressure. Lipid and cholesterol levels. These may be checked every 5 years, or more frequently if you are over 11 years old. Skin check. Lung cancer screening. You may have this screening every year starting at age 79 if you have a 30-pack-year history of smoking and currently smoke or have quit within the past 15 years. Fecal occult blood test (FOBT) of the stool. You may have this test every year starting at age 12. Flexible sigmoidoscopy or colonoscopy. You may have a sigmoidoscopy every 5 years or a colonoscopy every 10 years starting at age 54. Hepatitis C blood test. Hepatitis B blood test. Sexually transmitted disease (STD) testing. Diabetes screening. This is done by checking your blood sugar (glucose) after you have not eaten for a while (fasting). You may have this done every 1-3 years. Bone density scan. This is done to screen for osteoporosis. You may have this done starting at age 90. Mammogram. This may be done every 1-2 years. Talk to your health care provider about how often you should have regular mammograms. Talk with your health care provider about your test results, treatment options, and if necessary, the need for more tests. Vaccines  Your health  care provider may recommend certain vaccines, such as: Influenza vaccine. This is recommended every year. Tetanus,  diphtheria, and acellular pertussis (Tdap, Td) vaccine. You may need a Td booster every 10 years. Zoster vaccine. You may need this after age 80. Pneumococcal 13-valent conjugate (PCV13) vaccine. One dose is recommended after age 75. Pneumococcal polysaccharide (PPSV23) vaccine. One dose is recommended after age 66. Talk to your health care provider about which screenings and vaccines you need and how often you need them. This information is not intended to replace advice given to you by your health care provider. Make sure you discuss any questions you have with your health care provider. Document Released: 08/07/2015 Document Revised: 03/30/2016 Document Reviewed: 05/12/2015 Elsevier Interactive Patient Education  2017 [employer] Prevention in the Home Falls can cause injuries. They can happen to people of all ages. There are many things you can do to make your home safe and to help prevent falls. What can I do on the outside of my home? Regularly fix the edges of walkways and driveways and fix any cracks. Remove anything that might make you trip as you walk through a door, such as a raised step or threshold. Trim any bushes or trees on the path to your home. Use bright outdoor lighting. Clear any walking paths of anything that might make someone trip, such as rocks or tools. Regularly check to see if handrails are loose or broken. Make sure that both sides of any steps have handrails. Any raised decks and porches should have guardrails on the edges. Have any leaves, snow, or ice cleared regularly. Use sand or salt on walking paths during winter. Clean up any spills in your garage right away. This includes oil or grease spills. What can I do in the bathroom? Use night lights. Install grab bars by the toilet and in the tub and shower. Do not use towel bars as grab bars. Use non-skid mats or decals in the tub or shower. If you need to sit down in the shower, use a plastic,  non-slip stool. Keep the floor dry. Clean up any water that spills on the floor as soon as it happens. Remove soap buildup in the tub or shower regularly. Attach bath mats securely with double-sided non-slip rug tape. Do not have throw rugs and other things on the floor that can make you trip. What can I do in the bedroom? Use night lights. Make sure that you have a light by your bed that is easy to reach. Do not use any sheets or blankets that are too big for your bed. They should not hang down onto the floor. Have a firm chair that has side arms. You can use this for support while you get dressed. Do not have throw rugs and other things on the floor that can make you trip. What can I do in the kitchen? Clean up any spills right away. Avoid walking on wet floors. Keep items that you use a lot in easy-to-reach places. If you need to reach something above you, use a strong step stool that has a grab bar. Keep electrical cords out of the way. Do not use floor polish or wax that makes floors slippery. If you must use wax, use non-skid floor wax. Do not have throw rugs and other things on the floor that can make you trip. What can I do with my stairs? Do not leave any items on the stairs. Make sure that there are handrails on  both sides of the stairs and use them. Fix handrails that are broken or loose. Make sure that handrails are as long as the stairways. Check any carpeting to make sure that it is firmly attached to the stairs. Fix any carpet that is loose or worn. Avoid having throw rugs at the top or bottom of the stairs. If you do have throw rugs, attach them to the floor with carpet tape. Make sure that you have a light switch at the top of the stairs and the bottom of the stairs. If you do not have them, ask someone to add them for you. What else can I do to help prevent falls? Wear shoes that: Do not have high heels. Have rubber bottoms. Are comfortable and fit you well. Are closed  at the toe. Do not wear sandals. If you use a stepladder: Make sure that it is fully opened. Do not climb a closed stepladder. Make sure that both sides of the stepladder are locked into place. Ask someone to hold it for you, if possible. Clearly mark and make sure that you can see: Any grab bars or handrails. First and last steps. Where the edge of each step is. Use tools that help you move around (mobility aids) if they are needed. These include: Canes. Walkers. Scooters. Crutches. Turn on the lights when you go into a dark area. Replace any light bulbs as soon as they burn out. Set up your furniture so you have a clear path. Avoid moving your furniture around. If any of your floors are uneven, fix them. If there are any pets around you, be aware of where they are. Review your medicines with your doctor. Some medicines can make you feel dizzy. This can increase your chance of falling. Ask your doctor what other things that you can do to help prevent falls. This information is not intended to replace advice given to you by your health care provider. Make sure you discuss any questions you have with your health care provider. Document Released: 05/07/2009 Document Revised: 12/17/2015 Document Reviewed: 08/15/2014 Elsevier Interactive Patient Education  2017 [employer].

## 2021-07-12 DIAGNOSIS — R109 Unspecified abdominal pain: Secondary | ICD-10-CM | POA: Diagnosis not present

## 2021-07-12 DIAGNOSIS — K5900 Constipation, unspecified: Secondary | ICD-10-CM | POA: Diagnosis not present

## 2021-08-17 ENCOUNTER — Other Ambulatory Visit: Payer: Self-pay | Admitting: Family Medicine

## 2021-08-17 DIAGNOSIS — Z1231 Encounter for screening mammogram for malignant neoplasm of breast: Secondary | ICD-10-CM

## 2021-08-27 ENCOUNTER — Ambulatory Visit (INDEPENDENT_AMBULATORY_CARE_PROVIDER_SITE_OTHER): Payer: Medicare Other | Admitting: Family

## 2021-08-27 ENCOUNTER — Other Ambulatory Visit: Payer: Self-pay

## 2021-08-27 ENCOUNTER — Encounter: Payer: Self-pay | Admitting: Family

## 2021-08-27 VITALS — BP 115/78 | HR 81 | Temp 97.5°F | Ht 61.0 in | Wt 139.1 lb

## 2021-08-27 DIAGNOSIS — J069 Acute upper respiratory infection, unspecified: Secondary | ICD-10-CM | POA: Diagnosis not present

## 2021-08-27 MED ORDER — PREDNISONE 20 MG PO TABS
ORAL_TABLET | ORAL | 0 refills | Status: DC
Start: 2021-08-27 — End: 2021-09-30

## 2021-08-27 NOTE — Assessment & Plan Note (Signed)
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 sending low dose prednisone, pt advised on use & SE, continue OTC sinus meds, Advil prn, drink at least 2liters water qd.
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
## 2021-08-27 NOTE — Patient Instructions (Signed)
It was very nice to see you today!  I have sent the prednisone to your pharmacy, take as directed on the bottle. Drink at least 2 liters water daily. Continue your home Advil every 8 hours as needed for next few days. Take the Norel AD samples to help with sinus congestion & drainage. 1 tablet daily or twice a day as needed.     PLEASE NOTE:  If you had any lab tests please let us know if you have not heard back within a few days. You may see your results on MyChart before we have a chance to review them but we will give you a call once they are reviewed by [place]. If we ordered any referrals today, please let us know if you have not heard from their office within the next week.   Please try these tips to maintain a healthy lifestyle:  Eat most of your calories during the day when you are active. Eliminate processed foods including packaged sweets (pies, cakes, cookies), reduce intake of potatoes, white bread, white pasta, and white rice. Look for whole grain options, oat flour or almond flour.  Each meal should contain half fruits/vegetables, one quarter protein, and one quarter carbs (no bigger than a computer mouse).  Cut down on sweet beverages. This includes juice, soda, and sweet tea. Also watch fruit intake, though this is a healthier sweet option, it still contains natural sugar! Limit to 3 servings daily.  Drink at least 1 glass of water with each meal and aim for at least 8 glasses per day  Exercise at least 150 minutes every week.

## 2021-09-02 ENCOUNTER — Ambulatory Visit: Payer: Medicare Other

## 2021-09-15 ENCOUNTER — Ambulatory Visit
Admission: RE | Admit: 2021-09-15 | Discharge: 2021-09-15 | Disposition: A | Discharge status: Home / Self Care | Payer: Medicare Other | Source: Ambulatory Visit | Attending: Family Medicine | Admitting: Family Medicine

## 2021-09-15 DIAGNOSIS — Z1231 Encounter for screening mammogram for malignant neoplasm of breast: Secondary | ICD-10-CM

## 2021-09-30 ENCOUNTER — Encounter: Payer: Self-pay | Admitting: Family Medicine

## 2021-09-30 ENCOUNTER — Other Ambulatory Visit: Payer: Self-pay

## 2021-09-30 ENCOUNTER — Ambulatory Visit (INDEPENDENT_AMBULATORY_CARE_PROVIDER_SITE_OTHER): Payer: Medicare Other | Admitting: Family Medicine

## 2021-09-30 VITALS — BP 97/61 | HR 72 | Temp 98.6°F | Ht 61.0 in | Wt 140.0 lb

## 2021-09-30 DIAGNOSIS — B380 Acute pulmonary coccidioidomycosis: Secondary | ICD-10-CM | POA: Diagnosis not present

## 2021-09-30 DIAGNOSIS — R7303 Prediabetes: Secondary | ICD-10-CM | POA: Diagnosis not present

## 2021-09-30 DIAGNOSIS — F419 Anxiety disorder, unspecified: Secondary | ICD-10-CM

## 2021-09-30 DIAGNOSIS — E559 Vitamin D deficiency, unspecified: Secondary | ICD-10-CM | POA: Diagnosis not present

## 2021-09-30 DIAGNOSIS — E782 Mixed hyperlipidemia: Secondary | ICD-10-CM

## 2021-09-30 DIAGNOSIS — M816 Localized osteoporosis [Lequesne]: Secondary | ICD-10-CM | POA: Diagnosis not present

## 2021-09-30 MED ORDER — LORAZEPAM 0.5 MG PO TABS: 0.5000 mg | ORAL_TABLET | Freq: Every day | ORAL | 1 refills | Status: DC | PRN

## 2021-09-30 MED ORDER — ALBUTEROL SULFATE HFA 108 (90 BASE) MCG/ACT IN AERS: 1.0000 | INHALATION_SPRAY | Freq: Four times a day (QID) | RESPIRATORY_TRACT | 11 refills | Status: DC | PRN

## 2021-09-30 MED ORDER — OMEPRAZOLE 20 MG PO CPDR: 20.0000 mg | DELAYED_RELEASE_CAPSULE | Freq: Every day | ORAL | 3 refills | Status: DC

## 2021-09-30 MED ORDER — ATORVASTATIN CALCIUM 20 MG PO TABS: 20.0000 mg | ORAL_TABLET | Freq: Every evening | ORAL | 3 refills | Status: DC

## 2021-09-30 MED ORDER — ALENDRONATE SODIUM 70 MG PO TABS: 70.0000 mg | ORAL_TABLET | ORAL | 11 refills | Status: DC
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
## 2021-09-30 NOTE — Patient Instructions (Addendum)
?  Schedule next appt as your physical and chronic conditions combine appt. We will complete your fasting labs that appt. also ?

## 2021-12-16 ENCOUNTER — Encounter: Payer: Self-pay | Admitting: Family Medicine

## 2021-12-16 ENCOUNTER — Ambulatory Visit (INDEPENDENT_AMBULATORY_CARE_PROVIDER_SITE_OTHER): Payer: Medicare Other | Admitting: Family Medicine

## 2021-12-16 VITALS — BP 104/63 | HR 76 | Temp 99.0°F | Ht 61.0 in | Wt 141.0 lb

## 2021-12-16 DIAGNOSIS — J219 Acute bronchiolitis, unspecified: Secondary | ICD-10-CM | POA: Diagnosis not present

## 2021-12-16 DIAGNOSIS — J4521 Mild intermittent asthma with (acute) exacerbation: Secondary | ICD-10-CM | POA: Diagnosis not present

## 2021-12-16 DIAGNOSIS — Z8619 Personal history of other infectious and parasitic diseases: Secondary | ICD-10-CM

## 2021-12-16 MED ORDER — METHYLPREDNISOLONE ACETATE 80 MG/ML IJ SUSP
80.0000 mg | Freq: Once | INTRAMUSCULAR | Status: AC
  Administered 2021-12-16: 80 mg via INTRAMUSCULAR

## 2021-12-16 MED ORDER — HYDROCODONE BIT-HOMATROP MBR 5-1.5 MG/5ML PO SOLN: ORAL | 0 refills | Status: DC

## 2021-12-16 MED ORDER — PREDNISONE 20 MG PO TABS: ORAL_TABLET | ORAL | 0 refills | Status: DC
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
## 2021-12-16 NOTE — Addendum Note (Signed)
Addended by: Deveron Furlong D on: 12/16/2021 03:18 PM   Modules accepted: Orders

## 2021-12-21 ENCOUNTER — Encounter: Payer: Self-pay | Admitting: Family Medicine

## 2021-12-21 ENCOUNTER — Telehealth: Payer: Self-pay

## 2021-12-21 ENCOUNTER — Telehealth (INDEPENDENT_AMBULATORY_CARE_PROVIDER_SITE_OTHER): Payer: Medicare Other | Admitting: Family Medicine

## 2021-12-21 VITALS — Wt 141.0 lb

## 2021-12-21 DIAGNOSIS — J209 Acute bronchitis, unspecified: Secondary | ICD-10-CM | POA: Diagnosis not present

## 2021-12-21 MED ORDER — DOXYCYCLINE HYCLATE 100 MG PO TABS: 100.0000 mg | ORAL_TABLET | Freq: Two times a day (BID) | ORAL | 0 refills | Status: DC

## 2021-12-21 MED ORDER — BENZONATATE 200 MG PO CAPS: 200.0000 mg | ORAL_CAPSULE | Freq: Two times a day (BID) | ORAL | 0 refills | Status: DC | PRN

## 2021-12-21 MED ORDER — FLUTICASONE PROPIONATE HFA 44 MCG/ACT IN AERO: 2.0000 | INHALATION_SPRAY | Freq: Two times a day (BID) | RESPIRATORY_TRACT | 0 refills | Status: DC

## 2021-12-21 NOTE — Telephone Encounter (Signed)
Pt scheduled for VV.
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
## 2021-12-21 NOTE — Telephone Encounter (Signed)
Patient seen today -- Per pharmacy her insurance is requiring Prior Auth for the inhaler.  She called at 4:55pm, She requested prior auth to be completed before we closed, she is leaving for vacation tomorrow morning.  I told patient that no one was able to get prior auth completed and approved within 5 minutes, could take up to 7-10 business days for approval.  She asked if medication could be changed, I said I will get a message back, but again, I am not sure if it can be done this evening.  Please call patient [phone]

## 2021-12-21 NOTE — Telephone Encounter (Signed)
Patient was seen on 5/25. Patient states she is not improving on the medication she was prescribed.  Try different meds along with antibiotic?   Patient only prescribed prednisone and cough syrup  Please call [phone].

## 2021-12-22 NOTE — Telephone Encounter (Signed)
Pt was informed to call insurance to see what would be cheaper on pt formulary list.

## 2021-12-23 MED ORDER — ALBUTEROL SULFATE (2.5 MG/3ML) 0.083% IN NEBU
2.5000 mg | INHALATION_SOLUTION | Freq: Once | RESPIRATORY_TRACT | Status: AC
  Administered 2021-12-23: 2.5 mg via RESPIRATORY_TRACT

## 2021-12-23 NOTE — Addendum Note (Signed)
Addended by: Deveron Furlong D on: 12/23/2021 08:57 AM   Modules accepted: Orders

## 2021-12-27 ENCOUNTER — Telehealth: Payer: Medicare Other | Admitting: Family Medicine

## 2021-12-27 ENCOUNTER — Encounter: Payer: Self-pay | Admitting: Family Medicine

## 2021-12-27 ENCOUNTER — Ambulatory Visit: Payer: Medicare Other | Admitting: Family Medicine

## 2021-12-27 VITALS — BP 120/77 | HR 94 | Temp 98.9°F | Ht 61.0 in | Wt 137.0 lb

## 2021-12-27 DIAGNOSIS — R110 Nausea: Secondary | ICD-10-CM | POA: Diagnosis not present

## 2021-12-27 DIAGNOSIS — J209 Acute bronchitis, unspecified: Secondary | ICD-10-CM

## 2021-12-27 DIAGNOSIS — R052 Subacute cough: Secondary | ICD-10-CM

## 2021-12-27 DIAGNOSIS — B380 Acute pulmonary coccidioidomycosis: Secondary | ICD-10-CM | POA: Diagnosis not present

## 2021-12-27 MED ORDER — ONDANSETRON HCL 4 MG/2ML IJ SOLN
4.0000 mg | Freq: Once | INTRAMUSCULAR | Status: AC
  Administered 2021-12-27: 4 mg via INTRAMUSCULAR

## 2021-12-27 MED ORDER — GABAPENTIN 100 MG PO CAPS: 100.0000 mg | ORAL_CAPSULE | Freq: Three times a day (TID) | ORAL | 3 refills | Status: DC
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
## 2021-12-27 NOTE — Patient Instructions (Signed)
Flovent 2 inhalations twice daily for 4 weeks.  Use albuterol every 2 hours as needed.   Gabapentin 100 mg every eight hours .

## 2021-12-28 ENCOUNTER — Telehealth: Payer: Self-pay

## 2021-12-28 MED ORDER — MOLNUPIRAVIR EUA 200MG CAPSULE: 4.0000 | ORAL_CAPSULE | Freq: Two times a day (BID) | ORAL | 0 refills | Status: DC

## 2021-12-28 NOTE — Telephone Encounter (Signed)
LVM for pt to CB regarding prior message

## 2021-12-28 NOTE — Addendum Note (Signed)
Addended by: Howard Pouch A on: 12/28/2021 01:39 PM   Modules accepted: Orders

## 2021-12-28 NOTE — Telephone Encounter (Signed)
Spoke with pt and informed her of medication and provider's instructions

## 2021-12-28 NOTE — Telephone Encounter (Signed)
Pt called to notify PCP that she tested positive for COVID today. Please advise if any other recommendations are needed at this time.

## 2021-12-28 NOTE — Telephone Encounter (Signed)
Recommendations I will remain the same, however I have called molnupiravir, which is the antiviral for COVID.  If she asks its like Paxlovid with a better safety profile for people who take other medications.

## 2021-12-28 NOTE — Telephone Encounter (Signed)
Please advise 

## 2021-12-29 MED ORDER — MOLNUPIRAVIR EUA 200MG CAPSULE: 4.0000 | ORAL_CAPSULE | Freq: Two times a day (BID) | ORAL | 0 refills | Status: AC

## 2021-12-29 MED ORDER — ONDANSETRON HCL 4 MG PO TABS: 4.0000 mg | ORAL_TABLET | Freq: Three times a day (TID) | ORAL | 0 refills | Status: DC | PRN

## 2021-12-29 NOTE — Telephone Encounter (Signed)
Called in [place] for her

## 2021-12-29 NOTE — Telephone Encounter (Signed)
Pt informed

## 2021-12-29 NOTE — Telephone Encounter (Signed)
Pt states she is on so many prescriptions right now. She would like to know if she can have Zofran for nausea.  [facility] [id number]  [place], [place] [address]  Phone:  [phone]  Fax:  [phone]   Pt's [phone]

## 2021-12-29 NOTE — Telephone Encounter (Signed)
Please advise 

## 2021-12-29 NOTE — Addendum Note (Signed)
Addended by: Howard Pouch A on: 12/29/2021 02:22 PM   Modules accepted: Orders

## 2022-01-07 ENCOUNTER — Telehealth: Payer: Self-pay

## 2022-01-07 NOTE — Telephone Encounter (Signed)
Patient states when she spoke to Dr. Raoul Pitch first of week of June provider offered patient to get chest xray done.  She declined it at the time.  Now patient thinks she should have it done. I told patient Dr. Raoul Pitch out of office until 6/20.  Patient understood.  Please call [phone].

## 2022-01-10 NOTE — Telephone Encounter (Signed)
Spoke with patient regarding results/recommendations.  That orders are already placed and she can go/call at any time to have xray done

## 2022-01-17 ENCOUNTER — Ambulatory Visit (HOSPITAL_BASED_OUTPATIENT_CLINIC_OR_DEPARTMENT_OTHER)
Admission: RE | Admit: 2022-01-17 | Discharge: 2022-01-17 | Disposition: A | Discharge status: Home / Self Care | Payer: Medicare Other | Source: Ambulatory Visit | Attending: Family Medicine | Admitting: Family Medicine

## 2022-01-17 DIAGNOSIS — R052 Subacute cough: Secondary | ICD-10-CM | POA: Diagnosis not present

## 2022-01-17 DIAGNOSIS — R059 Cough, unspecified: Secondary | ICD-10-CM | POA: Diagnosis not present

## 2022-01-18 ENCOUNTER — Telehealth: Payer: Self-pay | Admitting: Family Medicine

## 2022-01-18 DIAGNOSIS — B380 Acute pulmonary coccidioidomycosis: Secondary | ICD-10-CM

## 2022-01-18 DIAGNOSIS — R052 Subacute cough: Secondary | ICD-10-CM

## 2022-01-18 DIAGNOSIS — R911 Solitary pulmonary nodule: Secondary | ICD-10-CM

## 2022-01-18 DIAGNOSIS — J219 Acute bronchiolitis, unspecified: Secondary | ICD-10-CM

## 2022-01-19 NOTE — Telephone Encounter (Signed)
Spoke with pt regarding referral and instructions.

## 2022-01-22 ENCOUNTER — Other Ambulatory Visit: Payer: Self-pay | Admitting: Family Medicine

## 2022-02-03 ENCOUNTER — Encounter: Payer: Self-pay | Admitting: Emergency Medicine

## 2022-02-03 ENCOUNTER — Ambulatory Visit: Payer: Medicare Other | Admitting: Emergency Medicine

## 2022-02-03 VITALS — BP 136/70 | HR 80 | Temp 98.2°F | Ht 62.0 in | Wt 136.6 lb

## 2022-02-03 DIAGNOSIS — R053 Chronic cough: Secondary | ICD-10-CM | POA: Diagnosis not present

## 2022-02-03 DIAGNOSIS — R059 Cough, unspecified: Secondary | ICD-10-CM

## 2022-02-03 DIAGNOSIS — K219 Gastro-esophageal reflux disease without esophagitis: Secondary | ICD-10-CM | POA: Diagnosis not present

## 2022-02-03 DIAGNOSIS — Z87891 Personal history of nicotine dependence: Secondary | ICD-10-CM | POA: Diagnosis not present

## 2022-02-03 DIAGNOSIS — B380 Acute pulmonary coccidioidomycosis: Secondary | ICD-10-CM

## 2022-02-03 MED ORDER — LORATADINE 10 MG PO TABS: 10.0000 mg | ORAL_TABLET | Freq: Every day | ORAL | 1 refills | Status: DC

## 2022-02-03 MED ORDER — PANTOPRAZOLE SODIUM 40 MG PO TBEC: 40.0000 mg | DELAYED_RELEASE_TABLET | Freq: Every day | ORAL | 1 refills | Status: DC
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
## 2022-02-03 NOTE — Assessment & Plan Note (Signed)
She was treated for coccidiomycosis when she was in [place] and had serial imaging.  I have some notes regarding those images and apparently her CTs showed stable nodular disease through 2020.  I like to get copies of those films and then repeat a film here so we can compare especially since she is having flaring cough although it is unclear that the cough is related.

## 2022-02-03 NOTE — Patient Instructions (Signed)
We will perform a CT scan of the chest without contrast to follow your pulmonary nodules. We will obtain copies of your prior CTs from [place] so we can compare We will perform full pulmonary function testing in next office visit. Temporarily stop your omeprazole Please start pantoprazole 40 mg twice a day.  Take this medication 1 hour around food.  We will continue this until next visit. Try starting loratadine 10 mg (generic Claritin) once daily until next visit. Continue to use your Delsym as needed to suppress cough. Avoid throat clearing.  When you have the feeling that you need to clear your throat, just swallow instead.  You might benefit from using a sugar-free candy or not methylated cough drop to avoid throat clearing Follow Dr. Lamonte Sakai next available with full PFT on the same day.

## 2022-02-03 NOTE — Assessment & Plan Note (Signed)
Minimal tobacco history.  We will perform PFT given her persistent and recurrent coughing as below.

## 2022-02-03 NOTE — Assessment & Plan Note (Signed)
Sounds upper airway in nature.  We will perform PFT to rule out lower airways disease.  Rhinitis, GERD are likely contributors.  It sounds like she also has had upper respiratory infections, COVID-19 that contributed to severity and persistence.  She was tried on gabapentin briefly but did not tolerate.  We will try to suppress cough, eliminate sustaining factors.  Repeat imaging planned.  We will perform a CT scan of the chest without contrast to follow your pulmonary nodules. We will obtain copies of your prior CTs from [place] so we can compare We will perform full pulmonary function testing in next office visit. Temporarily stop your omeprazole Please start pantoprazole 40 mg twice a day.  Take this medication 1 hour around food.  We will continue this until next visit. Try starting loratadine 10 mg (generic Claritin) once daily until next visit. Continue to use your Delsym as needed to suppress cough. Avoid throat clearing.  When you have the feeling that you need to clear your throat, just swallow instead.  You might benefit from using a sugar-free candy or not methylated cough drop to avoid throat clearing Follow Dr. Lamonte Sakai next available with full PFT on the same day.

## 2022-02-03 NOTE — Assessment & Plan Note (Signed)
Will temporarily increase her PPI given the potential impact of breakthrough GERD on sustaining her chronic cough.

## 2022-02-19 ENCOUNTER — Ambulatory Visit (HOSPITAL_BASED_OUTPATIENT_CLINIC_OR_DEPARTMENT_OTHER): Payer: Medicare Other

## 2022-02-22 ENCOUNTER — Ambulatory Visit (HOSPITAL_BASED_OUTPATIENT_CLINIC_OR_DEPARTMENT_OTHER): Payer: Medicare Other

## 2022-02-24 ENCOUNTER — Ambulatory Visit (HOSPITAL_BASED_OUTPATIENT_CLINIC_OR_DEPARTMENT_OTHER)
Admission: RE | Admit: 2022-02-24 | Discharge: 2022-02-24 | Disposition: A | Discharge status: Home / Self Care | Payer: Medicare Other | Source: Ambulatory Visit | Attending: Emergency Medicine | Admitting: Emergency Medicine

## 2022-02-24 ENCOUNTER — Other Ambulatory Visit (HOSPITAL_BASED_OUTPATIENT_CLINIC_OR_DEPARTMENT_OTHER): Payer: Medicare Other

## 2022-02-24 DIAGNOSIS — R059 Cough, unspecified: Secondary | ICD-10-CM | POA: Insufficient documentation

## 2022-02-24 DIAGNOSIS — R911 Solitary pulmonary nodule: Secondary | ICD-10-CM | POA: Diagnosis not present

## 2022-02-24 DIAGNOSIS — I2510 Atherosclerotic heart disease of native coronary artery without angina pectoris: Secondary | ICD-10-CM | POA: Diagnosis not present

## 2022-02-24 DIAGNOSIS — I3139 Other pericardial effusion (noninflammatory): Secondary | ICD-10-CM | POA: Diagnosis not present

## 2022-02-28 ENCOUNTER — Telehealth: Payer: Self-pay | Admitting: Emergency Medicine

## 2022-02-28 NOTE — Telephone Encounter (Signed)
Spoke to patient. She is requesting CT results from 02/24/22.  Dr. Lamonte Sakai, please advise. Thanks

## 2022-02-28 NOTE — Telephone Encounter (Signed)
Please let her know that her CT chest shows the same small right upper lobe nodule that has been noted on her scans from [place]. No enlargement. Good news. We can review further at her ROV

## 2022-03-01 NOTE — Telephone Encounter (Signed)
Called and spoke to pt. Informed her of the results and recs per Dr. Lamonte Sakai. Pt states she read the impression in MyChart and she states the wording is concerning, "Based on upper lobe location and presence of single lesion bronchogenic neoplasm is considered." Pt states this verbiage was not mentioned in the last scan. Pt was worried when we spoke.   Dr. Lamonte Sakai, please advise. Thanks.

## 2022-03-02 NOTE — Telephone Encounter (Signed)
Called and spoke with pt letting her know the info per RB and she verbalized understanding. Nothing further needed. ?

## 2022-03-02 NOTE — Telephone Encounter (Signed)
The radiologist is commenting on the appearance of the nodule, but does not have the benefit of knowing that we have old scans that show interval stability. The stability practically rules out a lung cancer.

## 2022-03-02 NOTE — Telephone Encounter (Signed)
Patient is returning phone call. Patient phone number is [phone]. Would like a call before 4:00 pm. Leaving to go out of town.

## 2022-03-02 NOTE — Telephone Encounter (Signed)
Attempted to call pt but unable to reach. Left message for her to return call. 

## 2022-03-22 ENCOUNTER — Telehealth: Payer: Self-pay | Admitting: Emergency Medicine

## 2022-03-22 ENCOUNTER — Ambulatory Visit (INDEPENDENT_AMBULATORY_CARE_PROVIDER_SITE_OTHER): Payer: Medicare Other | Admitting: Family Medicine

## 2022-03-22 ENCOUNTER — Encounter: Payer: Self-pay | Admitting: Family Medicine

## 2022-03-22 VITALS — BP 92/57 | HR 72 | Temp 98.3°F | Ht 61.5 in | Wt 137.0 lb

## 2022-03-22 DIAGNOSIS — I2584 Coronary atherosclerosis due to calcified coronary lesion: Secondary | ICD-10-CM | POA: Diagnosis not present

## 2022-03-22 DIAGNOSIS — R911 Solitary pulmonary nodule: Secondary | ICD-10-CM | POA: Diagnosis not present

## 2022-03-22 DIAGNOSIS — N281 Cyst of kidney, acquired: Secondary | ICD-10-CM | POA: Insufficient documentation

## 2022-03-22 DIAGNOSIS — F419 Anxiety disorder, unspecified: Secondary | ICD-10-CM

## 2022-03-22 DIAGNOSIS — E559 Vitamin D deficiency, unspecified: Secondary | ICD-10-CM

## 2022-03-22 DIAGNOSIS — Z1231 Encounter for screening mammogram for malignant neoplasm of breast: Secondary | ICD-10-CM

## 2022-03-22 DIAGNOSIS — B380 Acute pulmonary coccidioidomycosis: Secondary | ICD-10-CM | POA: Diagnosis not present

## 2022-03-22 DIAGNOSIS — R7303 Prediabetes: Secondary | ICD-10-CM

## 2022-03-22 DIAGNOSIS — N2889 Other specified disorders of kidney and ureter: Secondary | ICD-10-CM

## 2022-03-22 DIAGNOSIS — E782 Mixed hyperlipidemia: Secondary | ICD-10-CM

## 2022-03-22 DIAGNOSIS — I2510 Atherosclerotic heart disease of native coronary artery without angina pectoris: Secondary | ICD-10-CM | POA: Diagnosis not present

## 2022-03-22 DIAGNOSIS — Z8249 Family history of ischemic heart disease and other diseases of the circulatory system: Secondary | ICD-10-CM | POA: Diagnosis not present

## 2022-03-22 DIAGNOSIS — M816 Localized osteoporosis [Lequesne]: Secondary | ICD-10-CM | POA: Diagnosis not present

## 2022-03-22 DIAGNOSIS — Z0000 Encounter for general adult medical examination without abnormal findings: Secondary | ICD-10-CM | POA: Diagnosis not present

## 2022-03-22 LAB — CBC
HCT: 40.2 % (ref 36.0–46.0)
Hemoglobin: 13.7 g/dL (ref 12.0–15.0)
MCHC: 33.9 g/dL (ref 30.0–36.0)
MCV: 89.7 fl (ref 78.0–100.0)
Platelets: 191 10*3/uL (ref 150.0–400.0)
RBC: 4.49 Mil/uL (ref 3.87–5.11)
RDW: 13.5 % (ref 11.5–15.5)
WBC: 5.6 10*3/uL (ref 4.0–10.5)

## 2022-03-22 LAB — TSH: TSH: 2.08 u[IU]/mL (ref 0.35–5.50)

## 2022-03-22 LAB — HEMOGLOBIN A1C: Hgb A1c MFr Bld: 6.3 % (ref 4.6–6.5)

## 2022-03-22 LAB — VITAMIN D 25 HYDROXY (VIT D DEFICIENCY, FRACTURES): VITD: 32.17 ng/mL (ref 30.00–100.00)

## 2022-03-22 MED ORDER — ATORVASTATIN CALCIUM 20 MG PO TABS
20.0000 mg | ORAL_TABLET | Freq: Every evening | ORAL | 3 refills | Status: DC
Start: 2022-03-22 — End: 2023-03-01

## 2022-03-22 MED ORDER — ALENDRONATE SODIUM 70 MG PO TABS
70.0000 mg | ORAL_TABLET | ORAL | 11 refills | Status: DC
Start: 2022-03-22 — End: 2023-03-01

## 2022-03-22 MED ORDER — SHINGRIX 50 MCG/0.5ML IM SUSR: 0.5000 mL | Freq: Once | INTRAMUSCULAR | 1 refills | Status: AC

## 2022-03-22 MED ORDER — FLUTICASONE PROPIONATE HFA 44 MCG/ACT IN AERO
2.0000 | INHALATION_SPRAY | Freq: Two times a day (BID) | RESPIRATORY_TRACT | 11 refills | Status: DC
Start: 2022-03-22 — End: 2022-09-14

## 2022-03-22 MED ORDER — LORAZEPAM 0.5 MG PO TABS: 0.5000 mg | ORAL_TABLET | Freq: Every day | ORAL | 1 refills | Status: DC | PRN

## 2022-03-22 MED ORDER — ALBUTEROL SULFATE HFA 108 (90 BASE) MCG/ACT IN AERS: 1.0000 | INHALATION_SPRAY | Freq: Four times a day (QID) | RESPIRATORY_TRACT | 11 refills | Status: DC | PRN

## 2022-03-22 MED ORDER — TETANUS-DIPHTH-ACELL PERTUSSIS 5-2.5-18.5 LF-MCG/0.5 IM SUSP: 0.5000 mL | Freq: Once | INTRAMUSCULAR | 0 refills | Status: AC
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
## 2022-03-22 NOTE — Patient Instructions (Signed)
No follow-ups on file.        Great to see you today.  I have refilled the medication(s) we provide.   If labs were collected, we will inform you of lab results once received either by echart message or telephone call.   - echart message- for normal results that have been seen by the patient already.   - telephone call: abnormal results or if patient has not viewed results in their echart.  Health Maintenance After Age 69 After age 19, you are at a higher risk for certain long-term diseases and infections as well as injuries from falls. Falls are a major cause of broken bones and head injuries in people who are older than age 57. Getting regular preventive care can help to keep you healthy and well. Preventive care includes getting regular testing and making lifestyle changes as recommended by your health care provider. Talk with your health care provider about: Which screenings and tests you should have. A screening is a test that checks for a disease when you have no symptoms. A diet and exercise plan that is right for you. What should I know about screenings and tests to prevent falls? Screening and testing are the best ways to find a health problem early. Early diagnosis and treatment give you the best chance of managing medical conditions that are common after age 57. Certain conditions and lifestyle choices may make you more likely to have a fall. Your health care provider may recommend: Regular vision checks. Poor vision and conditions such as cataracts can make you more likely to have a fall. If you wear glasses, make sure to get your prescription updated if your vision changes. Medicine review. Work with your health care provider to regularly review all of the medicines you are taking, including over-the-counter medicines. Ask your health care provider about any side effects that may make you more likely to have a fall. Tell your health care provider if any medicines that you take make  you feel dizzy or sleepy. Strength and balance checks. Your health care provider may recommend certain tests to check your strength and balance while standing, walking, or changing positions. Foot health exam. Foot pain and numbness, as well as not wearing proper footwear, can make you more likely to have a fall. Screenings, including: Osteoporosis screening. Osteoporosis is a condition that causes the bones to get weaker and break more easily. Blood pressure screening. Blood pressure changes and medicines to control blood pressure can make you feel dizzy. Depression screening. You may be more likely to have a fall if you have a fear of falling, feel depressed, or feel unable to do activities that you used to do. Alcohol use screening. Using too much alcohol can affect your balance and may make you more likely to have a fall. Follow these instructions at home: Lifestyle Do not drink alcohol if: Your health care provider tells you not to drink. If you drink alcohol: Limit how much you have to: 0-1 drink a day for women. 0-2 drinks a day for men. Know how much alcohol is in your drink. In the [place] one drink equals one 12 oz bottle of beer (355 mL), one 5 oz glass of wine (148 mL), or one 1 oz glass of hard liquor (44 mL). Do not use any products that contain nicotine or tobacco. These products include cigarettes, chewing tobacco, and vaping devices, such as e-cigarettes. If you need help quitting, ask your health care provider. Activity  Follow  a regular exercise program to stay fit. This will help you maintain your balance. Ask your health care provider what types of exercise are appropriate for you. If you need a cane or walker, use it as recommended by your health care provider. Wear supportive shoes that have nonskid soles. Safety  Remove any tripping hazards, such as rugs, cords, and clutter. Install safety equipment such as grab bars in bathrooms and safety rails on stairs. Keep  rooms and walkways well-lit. General instructions Talk with your health care provider about your risks for falling. Tell your health care provider if: You fall. Be sure to tell your health care provider about all falls, even ones that seem minor. You feel dizzy, tiredness (fatigue), or off-balance. Take over-the-counter and prescription medicines only as told by your health care provider. These include supplements. Eat a healthy diet and maintain a healthy weight. A healthy diet includes low-fat dairy products, low-fat (lean) meats, and fiber from whole grains, beans, and lots of fruits and vegetables. Stay current with your vaccines. Schedule regular health, dental, and eye exams. Summary Having a healthy lifestyle and getting preventive care can help to protect your health and wellness after age 32. Screening and testing are the best way to find a health problem early and help you avoid having a fall. Early diagnosis and treatment give you the best chance for managing medical conditions that are more common for people who are older than age 67. Falls are a major cause of broken bones and head injuries in people who are older than age 25. Take precautions to prevent a fall at home. Work with your health care provider to learn what changes you can make to improve your health and wellness and to prevent falls. This information is not intended to replace advice given to you by your health care provider. Make sure you discuss any questions you have with your health care provider. Document Revised: 11/30/2020 Document Reviewed: 11/30/2020 Elsevier Patient Education  [place].

## 2022-03-23 LAB — COMPREHENSIVE METABOLIC PANEL
ALT: 16 U/L (ref 0–35)
AST: 16 U/L (ref 0–37)
Albumin: 4.3 g/dL (ref 3.5–5.2)
Alkaline Phosphatase: 66 U/L (ref 39–117)
BUN: 23 mg/dL (ref 6–23)
CO2: 26 mEq/L (ref 19–32)
Calcium: 9.3 mg/dL (ref 8.4–10.5)
Chloride: 104 mEq/L (ref 96–112)
Creatinine, Ser: 0.7 mg/dL (ref 0.40–1.20)
GFR: 88.34 mL/min (ref >60.00)
Glucose, Bld: 85 mg/dL (ref 70–99)
Potassium: 4.5 mEq/L (ref 3.5–5.1)
Sodium: 139 mEq/L (ref 135–145)
Total Bilirubin: 0.5 mg/dL (ref 0.2–1.2)
Total Protein: 6.7 g/dL (ref 6.0–8.3)

## 2022-03-23 LAB — LIPID PANEL
Cholesterol: 218 mg/dL — ABNORMAL HIGH (ref 0–200)
HDL: 64 mg/dL (ref >39.00)
LDL Cholesterol: 132 mg/dL — ABNORMAL HIGH (ref 0–99)
NonHDL: 153.99
Total CHOL/HDL Ratio: 3
Triglycerides: 111 mg/dL (ref 0.0–149.0)
VLDL: 22.2 mg/dL (ref 0.0–40.0)

## 2022-03-25 ENCOUNTER — Ambulatory Visit (INDEPENDENT_AMBULATORY_CARE_PROVIDER_SITE_OTHER): Payer: Medicare Other | Admitting: Internal Medicine

## 2022-03-25 ENCOUNTER — Encounter: Payer: Self-pay | Admitting: Internal Medicine

## 2022-03-25 DIAGNOSIS — H60392 Other infective otitis externa, left ear: Secondary | ICD-10-CM

## 2022-03-25 DIAGNOSIS — H6090 Unspecified otitis externa, unspecified ear: Secondary | ICD-10-CM | POA: Insufficient documentation

## 2022-03-25 MED ORDER — NEOMYCIN-POLYMYXIN-HC 3.5-10000-1 OT SOLN: 3.0000 [drp] | Freq: Three times a day (TID) | OTIC | 0 refills | Status: DC

## 2022-03-25 NOTE — Assessment & Plan Note (Signed)
Rx cortisporin ear drops 3 day course. Advised to use afrin prior to flying as she is traveling to [place] tomorrow.

## 2022-03-25 NOTE — Patient Instructions (Signed)
We have sent in the ear drops to use 3 drops 3 times a day for 2 days for the fluid.   Take afrin with you to fly use 1 spray each nostril once you are on plane or about to board.

## 2022-03-25 NOTE — Progress Notes (Signed)
   Subjective:   Patient ID: Crystal Horn, female    DOB: [birth year], 71 y.o.   MRN: [id number]  HPI The patient is a 71 YO female coming in for left ear pain.  Review of Systems  Constitutional: Negative.   HENT:  Positive for ear pain.   Eyes: Negative.   Respiratory:  Negative for cough, chest tightness and shortness of breath.   Cardiovascular:  Negative for chest pain, palpitations and leg swelling.  Gastrointestinal:  Negative for abdominal distention, abdominal pain, constipation, diarrhea, nausea and vomiting.  Musculoskeletal: Negative.   Skin: Negative.   Neurological: Negative.   Psychiatric/Behavioral: Negative.      Objective:  Physical Exam Constitutional:      Appearance: She is well-developed.  HENT:     Head: Normocephalic and atraumatic.     Ears:     Comments: Left TM bulging clear fluid Cardiovascular:     Rate and Rhythm: Normal rate and regular rhythm.  Pulmonary:     Effort: Pulmonary effort is normal. No respiratory distress.     Breath sounds: Normal breath sounds. No wheezing or rales.  Abdominal:     General: Bowel sounds are normal. There is no distension.     Palpations: Abdomen is soft.     Tenderness: There is no abdominal tenderness. There is no rebound.  Musculoskeletal:     Cervical back: Normal range of motion.  Skin:    General: Skin is warm and dry.  Neurological:     Mental Status: She is alert and oriented to person, place, and time.     Coordination: Coordination normal.     Vitals:   03/25/22 1440  BP: 110/80  Pulse: 75  Temp: 98.7 F (37.1 C)  TempSrc: Oral  SpO2: 93%  Weight: 137 lb (62.1 kg)  Height: 5' 1.5" (1.562 m)    Assessment & Plan:

## 2022-03-25 NOTE — Telephone Encounter (Signed)
Call made to pt, confirmed DOB. She states she did not have any recent tests done that needed f/U on however she wanted to check since her PCP told her she may need a biopsy. I made her aware he may have been referring to her CT scan from August however RB stated no further intervention was needed at this time. I made her  aware the most recent labs as well as the results per the the provider. Voiced understanding. I told her if she speaks with her provider and something needs to be addressed to just call us and let us know and that the PCP can call us as well. Voiced understanding.   Nothing further needed at this time.

## 2022-03-30 ENCOUNTER — Ambulatory Visit (HOSPITAL_BASED_OUTPATIENT_CLINIC_OR_DEPARTMENT_OTHER)
Admission: RE | Admit: 2022-03-30 | Discharge: 2022-03-30 | Disposition: A | Discharge status: Home / Self Care | Payer: Medicare Other | Source: Ambulatory Visit | Attending: Family Medicine | Admitting: Family Medicine

## 2022-03-30 DIAGNOSIS — N281 Cyst of kidney, acquired: Secondary | ICD-10-CM | POA: Diagnosis not present

## 2022-03-30 DIAGNOSIS — N2889 Other specified disorders of kidney and ureter: Secondary | ICD-10-CM | POA: Diagnosis not present

## 2022-03-31 ENCOUNTER — Telehealth: Payer: Self-pay | Admitting: Family Medicine

## 2022-03-31 DIAGNOSIS — N2889 Other specified disorders of kidney and ureter: Secondary | ICD-10-CM

## 2022-03-31 DIAGNOSIS — R160 Hepatomegaly, not elsewhere classified: Secondary | ICD-10-CM | POA: Insufficient documentation

## 2022-03-31 NOTE — Telephone Encounter (Signed)
Please inform patient the following information: Her left kidney ultrasound result is good.  Cyst does not look worrisome, but it is 7.6 cm-which is a rather significant size.  This can cause lower back discomfort at this size.  I have placed a referral to urology for her so she can discuss options with them.  Urologist will often perform a procedure to aspirate, or unroof, benign kidney cyst to prevent/relieve pain when they are of the size.    There was incidental findings in her liver: Two small masses that appeared to be hemangiomas (see which are normally not considered worrisome at this size), but needs further evaluation to be certain.   I did look back through her image study records, and it looks like 1 of those masses have been present for quite some time and stable.  However, the other  I cannot find record of, therefore we would need to proceed with an MRI in order to be certain it is not something we need to continue to follow.  In order to place the MRI order, we will need to have proper documentation with order.  If she is interested in proceeding with the MRI, please have her schedule an appointment to discuss.  This can be virtual if she desires.

## 2022-03-31 NOTE — Telephone Encounter (Signed)
Spoke with pt regarding labs and instructions.   

## 2022-04-04 ENCOUNTER — Telehealth (INDEPENDENT_AMBULATORY_CARE_PROVIDER_SITE_OTHER): Payer: Medicare Other | Admitting: Family Medicine

## 2022-04-04 ENCOUNTER — Ambulatory Visit (INDEPENDENT_AMBULATORY_CARE_PROVIDER_SITE_OTHER): Payer: Medicare Other | Admitting: Urology

## 2022-04-04 ENCOUNTER — Encounter: Payer: Self-pay | Admitting: Family Medicine

## 2022-04-04 ENCOUNTER — Telehealth: Payer: Self-pay

## 2022-04-04 ENCOUNTER — Encounter: Payer: Self-pay | Admitting: Urology

## 2022-04-04 DIAGNOSIS — B380 Acute pulmonary coccidioidomycosis: Secondary | ICD-10-CM | POA: Diagnosis not present

## 2022-04-04 DIAGNOSIS — R911 Solitary pulmonary nodule: Secondary | ICD-10-CM | POA: Diagnosis not present

## 2022-04-04 DIAGNOSIS — N2889 Other specified disorders of kidney and ureter: Secondary | ICD-10-CM

## 2022-04-04 DIAGNOSIS — R160 Hepatomegaly, not elsewhere classified: Secondary | ICD-10-CM

## 2022-04-04 NOTE — Patient Instructions (Signed)

## 2022-04-04 NOTE — Telephone Encounter (Signed)
Received voicemail from patient regarding question about [place]. Tried calling patient with no answer and left voice for patient to call office back.
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
## 2022-04-05 NOTE — Telephone Encounter (Signed)
Made patient aware that her kidney mass was 8 cm and it was not cancerous per Dr. Alyson Ingles. Patient was concern about the growth and how the mass develop. Made patient aware that Dr. Alyson Ingles will sign his note and she will be able to access the documentation on her mychart and patient voice understanding.

## 2022-04-06 ENCOUNTER — Ambulatory Visit: Payer: Medicare Other | Admitting: Emergency Medicine

## 2022-04-06 ENCOUNTER — Encounter: Payer: Self-pay | Admitting: Emergency Medicine

## 2022-04-06 VITALS — BP 118/76 | HR 80 | Temp 98.3°F | Ht 61.5 in | Wt 137.0 lb

## 2022-04-06 DIAGNOSIS — Z23 Encounter for immunization: Secondary | ICD-10-CM | POA: Diagnosis not present

## 2022-04-06 DIAGNOSIS — R053 Chronic cough: Secondary | ICD-10-CM | POA: Diagnosis not present

## 2022-04-06 DIAGNOSIS — K219 Gastro-esophageal reflux disease without esophagitis: Secondary | ICD-10-CM

## 2022-04-06 DIAGNOSIS — R059 Cough, unspecified: Secondary | ICD-10-CM

## 2022-04-06 DIAGNOSIS — R911 Solitary pulmonary nodule: Secondary | ICD-10-CM | POA: Diagnosis not present

## 2022-04-06 NOTE — Assessment & Plan Note (Signed)
Pulmonary function testing reassuring without any evidence of asthma.  Her flow-volume loop is normal.  Some evidence for hyperinflation but this is nonspecific.  I do not believe she has asthma.  I believe her cough has been upper airway in nature.  It has improved with more aggressive treatment of her GERD and the addition of loratadine for chronic rhinitis.  Plan to continue both.  She will call me if she has flaring symptoms.  Okay for the flu shot today

## 2022-04-06 NOTE — Assessment & Plan Note (Signed)
We reviewed her CT scan of the chest today.  She has a 9 x 9 mm right upper lobe pulmonary nodule that is peripheral, unchanged compared with CT chest from 01/02/2019 in [place].  She does not need any dedicated follow-up imaging.

## 2022-04-06 NOTE — Patient Instructions (Signed)
We reviewed your pulmonary function testing today. We reviewed your CT scan of the chest today.  Your small right upper lobe pulmonary nodule is unchanged compared with your prior films in [place].  This is great news.  You do not need any dedicated follow-up scans for this. Please continue your pantoprazole 40 mg once daily. Please continue to use either loratadine or Claritin 10 mg once daily. Agree with urology evaluation to discuss renal cyst. Follow Dr. Lamonte Sakai in 1 year or sooner if you have any flaring symptoms.
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
## 2022-04-06 NOTE — Assessment & Plan Note (Signed)
Has benefited from the change from omeprazole to pantoprazole.  Plan to continue pantoprazole once daily.

## 2022-04-07 LAB — PULMONARY FUNCTION TEST
DL/VA % pred: 79 %
DL/VA: 3.37 ml/min/mmHg/L
DLCO cor % pred: 93 %
DLCO cor: 16.78 ml/min/mmHg
DLCO unc % pred: 94 %
DLCO unc: 16.94 ml/min/mmHg
FEF 25-75 Post: 3.22 L/sec
FEF 25-75 Pre: 3.09 L/sec
FEF2575-%Change-Post: 4 %
FEF2575-%Pred-Post: 178 %
FEF2575-%Pred-Pre: 171 %
FEV1-%Change-Post: 0 %
FEV1-%Pred-Post: 143 %
FEV1-%Pred-Pre: 142 %
FEV1-Post: 2.96 L
FEV1-Pre: 2.94 L
FEV1FVC-%Change-Post: 1 %
FEV1FVC-%Pred-Pre: 107 %
FEV6-%Change-Post: 0 %
FEV6-%Pred-Post: 136 %
FEV6-%Pred-Pre: 137 %
FEV6-Post: 3.55 L
FEV6-Pre: 3.59 L
FEV6FVC-%Change-Post: 0 %
FEV6FVC-%Pred-Post: 104 %
FEV6FVC-%Pred-Pre: 104 %
FVC-%Change-Post: -1 %
FVC-%Pred-Post: 130 %
FVC-%Pred-Pre: 132 %
FVC-Post: 3.57 L
FVC-Pre: 3.61 L
Post FEV1/FVC ratio: 83 %
Post FEV6/FVC ratio: 100 %
Pre FEV1/FVC ratio: 82 %
Pre FEV6/FVC Ratio: 100 %
RV % pred: 126 %
RV: 2.58 L
TLC % pred: 137 %
TLC: 6.42 L

## 2022-04-14 ENCOUNTER — Ambulatory Visit (HOSPITAL_BASED_OUTPATIENT_CLINIC_OR_DEPARTMENT_OTHER)
Admission: RE | Admit: 2022-04-14 | Discharge: 2022-04-14 | Disposition: A | Discharge status: Home / Self Care | Payer: Medicare Other | Source: Ambulatory Visit | Attending: Family Medicine | Admitting: Family Medicine

## 2022-04-14 ENCOUNTER — Telehealth: Payer: Self-pay | Admitting: Family Medicine

## 2022-04-14 DIAGNOSIS — N281 Cyst of kidney, acquired: Secondary | ICD-10-CM | POA: Insufficient documentation

## 2022-04-14 DIAGNOSIS — K769 Liver disease, unspecified: Secondary | ICD-10-CM | POA: Diagnosis not present

## 2022-04-14 DIAGNOSIS — D1803 Hemangioma of intra-abdominal structures: Secondary | ICD-10-CM | POA: Diagnosis not present

## 2022-04-14 DIAGNOSIS — R160 Hepatomegaly, not elsewhere classified: Secondary | ICD-10-CM | POA: Diagnosis not present

## 2022-04-14 DIAGNOSIS — N289 Disorder of kidney and ureter, unspecified: Secondary | ICD-10-CM | POA: Diagnosis not present

## 2022-04-14 MED ORDER — GADOBUTROL 1 MMOL/ML IV SOLN
6.2000 mL | Freq: Once | INTRAVENOUS | Status: AC | PRN
  Administered 2022-04-14: 6.2 mL via INTRAVENOUS
  Filled 2022-04-14: qty 7.5

## 2022-04-14 NOTE — Telephone Encounter (Signed)
Pt advised of results. 

## 2022-04-14 NOTE — Telephone Encounter (Signed)
Please inform patient her liver MRI resulted and the 2 liver lesions are not concerning.  They are benign hemangiomas and need no further work-up.

## 2022-04-15 ENCOUNTER — Other Ambulatory Visit (HOSPITAL_BASED_OUTPATIENT_CLINIC_OR_DEPARTMENT_OTHER): Payer: Medicare Other

## 2022-04-26 ENCOUNTER — Ambulatory Visit (HOSPITAL_COMMUNITY): Payer: Medicare Other

## 2022-04-28 DIAGNOSIS — Z800 Family history of malignant neoplasm of digestive organs: Secondary | ICD-10-CM | POA: Diagnosis not present

## 2022-04-28 DIAGNOSIS — R1032 Left lower quadrant pain: Secondary | ICD-10-CM | POA: Diagnosis not present

## 2022-04-28 DIAGNOSIS — K5900 Constipation, unspecified: Secondary | ICD-10-CM | POA: Diagnosis not present

## 2022-04-28 DIAGNOSIS — K6289 Other specified diseases of anus and rectum: Secondary | ICD-10-CM | POA: Diagnosis not present

## 2022-04-28 DIAGNOSIS — R195 Other fecal abnormalities: Secondary | ICD-10-CM | POA: Diagnosis not present

## 2022-05-05 ENCOUNTER — Telehealth (INDEPENDENT_AMBULATORY_CARE_PROVIDER_SITE_OTHER): Payer: Medicare Other | Admitting: Family Medicine

## 2022-05-05 ENCOUNTER — Encounter: Payer: Self-pay | Admitting: Family Medicine

## 2022-05-05 VITALS — Temp 99.0°F

## 2022-05-05 DIAGNOSIS — R110 Nausea: Secondary | ICD-10-CM

## 2022-05-05 DIAGNOSIS — U071 COVID-19: Secondary | ICD-10-CM

## 2022-05-05 MED ORDER — NIRMATRELVIR/RITONAVIR (PAXLOVID)TABLET: 3.0000 | ORAL_TABLET | Freq: Two times a day (BID) | ORAL | 0 refills | Status: AC

## 2022-05-05 MED ORDER — ONDANSETRON HCL 4 MG PO TABS: 4.0000 mg | ORAL_TABLET | Freq: Three times a day (TID) | ORAL | 0 refills | Status: DC | PRN

## 2022-05-05 NOTE — Progress Notes (Signed)
Established Patient Office Visit  Subjective   Patient ID: Crystal Horn, female    DOB: 11-06-1950  Age: 71 y.o. MRN: [id number]  Chief Complaint  Patient presents with   Covid Positive    Patient tested positive for covid this morning, nausea no appetite, little cough symptoms x 3 days.     HPI reports a 2-day history of headache, elevated temperatures to 99 and chills, nasal congestion with postnasal drip, cough and nausea.  She denies sore throat, wheezing or difficulty breathing, myalgias or neuralgias.  She tested positive for COVID this morning.  This is her third bout with COVID.  She has had the initial vaccine series on with 2 boosters.  She is prediabetic.  With a history of [place] fever there is some pulmonary compromise and she is felt followed by the [facility]    Review of Systems  Constitutional:  Positive for chills and malaise/fatigue. Negative for fever.  HENT:  Positive for congestion. Negative for sore throat.   Eyes:  Negative for blurred vision, discharge and redness.  Respiratory:  Positive for cough. Negative for sputum production, shortness of breath and wheezing.   Cardiovascular: Negative.   Gastrointestinal:  Positive for nausea. Negative for abdominal pain and vomiting.  Genitourinary: Negative.   Musculoskeletal: Negative.  Negative for joint pain and myalgias.  Skin:  Negative for rash.  Neurological:  Positive for headaches. Negative for tingling, loss of consciousness and weakness.  Endo/Heme/Allergies:  Negative for polydipsia.      Objective:     Temp 99 F (37.2 C) (Oral)    Physical Exam Constitutional:      General: She is not in acute distress.    Appearance: Normal appearance. She is not ill-appearing, toxic-appearing or diaphoretic.  HENT:     Head: Normocephalic and atraumatic.     Right Ear: External ear normal.     Left Ear: External ear normal.  Eyes:     General: No scleral icterus.       Right eye:  No discharge.        Left eye: No discharge.     Extraocular Movements: Extraocular movements intact.     Conjunctiva/sclera: Conjunctivae normal.  Pulmonary:     Effort: Pulmonary effort is normal. No respiratory distress.  Skin:    General: Skin is warm and dry.  Neurological:     Mental Status: She is alert and oriented to person, place, and time.  Psychiatric:        Mood and Affect: Mood normal.        Behavior: Behavior normal.      No results found for any visits on 05/05/22.    The 10-year ASCVD risk score (Arnett DK, et al., 2019) is: 7.3%    Assessment & Plan:   Problem List Items Addressed This Visit   None Visit Diagnoses     COVID-19    -  Primary   Relevant Medications   nirmatrelvir/ritonavir EUA (PAXLOVID) 20 x 150 MG & 10 x '100MG'$  TABS   Nausea       Relevant Medications   ondansetron (ZOFRAN) 4 MG tablet       Return Follow-up with primary care provider next week..  Explained that Paxlovid is an experimental drug approved for emergency use only.  Believe that she qualifies by age, history of prediabetes and pulmonary compromise.  Zofran as needed for nausea.  Mucinex DM for cough.  Advised her to plan follow-up with  her primary care doctor next week..  Advised 5 days of quarantine.  Libby Maw, MD  Virtual Visit via Video Note  I connected with Karie Soda on 05/05/22 at  3:20 PM EDT by a video enabled telemedicine application and verified that I am speaking with the correct person using two identifiers.  Location: Patient: home with husband Provider: work   I discussed the limitations of evaluation and management by telemedicine and the availability of in person appointments. The patient expressed understanding and agreed to proceed.  History of Present Illness:    Observations/Objective:   Assessment and Plan:   Follow Up Instructions:    I discussed the assessment and treatment plan with the patient. The patient was  provided an opportunity to ask questions and all were answered. The patient agreed with the plan and demonstrated an understanding of the instructions.   The patient was advised to call back or seek an in-person evaluation if the symptoms worsen or if the condition fails to improve as anticipated.  I provided 20 minutes of non-face-to-face time during this encounter.   Libby Maw, MD

## 2022-05-10 ENCOUNTER — Other Ambulatory Visit: Payer: Self-pay | Admitting: Emergency Medicine

## 2022-05-11 ENCOUNTER — Telehealth: Payer: Self-pay | Admitting: Family Medicine

## 2022-05-11 NOTE — Telephone Encounter (Signed)
Advised pt that she could test positive for over a month. Isolation period is 5-14 days with mask. If still concerned about being contagious, continue to wear mask until negative result. No c/o sx

## 2022-05-11 NOTE — Telephone Encounter (Signed)
Pt is concerned about still testing positive 2.5 weeks later and  completing paxlovid. She would like someone to give her a call to discuss. She told me she feels fine and declined to schedule an appointment at this time.

## 2022-05-12 ENCOUNTER — Other Ambulatory Visit: Payer: Self-pay | Admitting: Emergency Medicine

## 2022-06-29 ENCOUNTER — Ambulatory Visit (INDEPENDENT_AMBULATORY_CARE_PROVIDER_SITE_OTHER): Payer: Medicare Other

## 2022-06-29 DIAGNOSIS — Z0000 Encounter for general adult medical examination without abnormal findings: Secondary | ICD-10-CM

## 2022-06-29 NOTE — Patient Instructions (Signed)
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
## 2022-07-11 ENCOUNTER — Other Ambulatory Visit: Payer: Self-pay | Admitting: Emergency Medicine

## 2022-08-10 DIAGNOSIS — R195 Other fecal abnormalities: Secondary | ICD-10-CM | POA: Diagnosis not present

## 2022-08-10 DIAGNOSIS — Z800 Family history of malignant neoplasm of digestive organs: Secondary | ICD-10-CM | POA: Diagnosis not present

## 2022-08-11 DIAGNOSIS — H40023 Open angle with borderline findings, high risk, bilateral: Secondary | ICD-10-CM | POA: Diagnosis not present

## 2022-08-11 DIAGNOSIS — H26492 Other secondary cataract, left eye: Secondary | ICD-10-CM | POA: Diagnosis not present

## 2022-08-11 DIAGNOSIS — H35361 Drusen (degenerative) of macula, right eye: Secondary | ICD-10-CM | POA: Diagnosis not present

## 2022-08-11 DIAGNOSIS — H35372 Puckering of macula, left eye: Secondary | ICD-10-CM | POA: Diagnosis not present

## 2022-08-17 DIAGNOSIS — K5730 Diverticulosis of large intestine without perforation or abscess without bleeding: Secondary | ICD-10-CM | POA: Diagnosis not present

## 2022-08-17 DIAGNOSIS — Z800 Family history of malignant neoplasm of digestive organs: Secondary | ICD-10-CM | POA: Diagnosis not present

## 2022-08-17 DIAGNOSIS — K644 Residual hemorrhoidal skin tags: Secondary | ICD-10-CM | POA: Diagnosis not present

## 2022-08-17 DIAGNOSIS — Z1211 Encounter for screening for malignant neoplasm of colon: Secondary | ICD-10-CM | POA: Diagnosis not present

## 2022-08-17 DIAGNOSIS — K648 Other hemorrhoids: Secondary | ICD-10-CM | POA: Diagnosis not present

## 2022-08-19 DIAGNOSIS — Z1211 Encounter for screening for malignant neoplasm of colon: Secondary | ICD-10-CM | POA: Diagnosis not present

## 2022-09-06 ENCOUNTER — Ambulatory Visit: Payer: Medicare Other | Admitting: Family Medicine

## 2022-09-07 ENCOUNTER — Ambulatory Visit: Payer: Medicare Other | Admitting: Family Medicine

## 2022-09-12 ENCOUNTER — Other Ambulatory Visit: Payer: Self-pay | Admitting: Emergency Medicine

## 2022-09-14 ENCOUNTER — Encounter: Payer: Self-pay | Admitting: Family Medicine

## 2022-09-14 ENCOUNTER — Ambulatory Visit (INDEPENDENT_AMBULATORY_CARE_PROVIDER_SITE_OTHER): Payer: Medicare Other | Admitting: Family Medicine

## 2022-09-14 VITALS — BP 97/70 | HR 76 | Temp 98.1°F | Wt 133.4 lb

## 2022-09-14 DIAGNOSIS — E782 Mixed hyperlipidemia: Secondary | ICD-10-CM | POA: Diagnosis not present

## 2022-09-14 DIAGNOSIS — R911 Solitary pulmonary nodule: Secondary | ICD-10-CM | POA: Diagnosis not present

## 2022-09-14 DIAGNOSIS — K219 Gastro-esophageal reflux disease without esophagitis: Secondary | ICD-10-CM | POA: Diagnosis not present

## 2022-09-14 DIAGNOSIS — M816 Localized osteoporosis [Lequesne]: Secondary | ICD-10-CM

## 2022-09-14 DIAGNOSIS — F419 Anxiety disorder, unspecified: Secondary | ICD-10-CM | POA: Diagnosis not present

## 2022-09-14 DIAGNOSIS — B380 Acute pulmonary coccidioidomycosis: Secondary | ICD-10-CM | POA: Diagnosis not present

## 2022-09-14 DIAGNOSIS — E559 Vitamin D deficiency, unspecified: Secondary | ICD-10-CM | POA: Diagnosis not present

## 2022-09-14 DIAGNOSIS — R7303 Prediabetes: Secondary | ICD-10-CM

## 2022-09-14 LAB — POCT GLYCOSYLATED HEMOGLOBIN (HGB A1C)
HbA1c POC (<> result, manual entry): 5.5 % (ref 4.0–5.6)
HbA1c, POC (controlled diabetic range): 5.5 % (ref 0.0–7.0)
HbA1c, POC (prediabetic range): 5.5 % — AB (ref 5.7–6.4)
Hemoglobin A1C: 5.5 % (ref 4.0–5.6)

## 2022-09-14 MED ORDER — LORATADINE 10 MG PO TABS: 10.0000 mg | ORAL_TABLET | Freq: Every day | ORAL | 3 refills | Status: DC

## 2022-09-14 MED ORDER — QVAR REDIHALER 40 MCG/ACT IN AERB: 1.0000 | INHALATION_SPRAY | Freq: Two times a day (BID) | RESPIRATORY_TRACT | 5 refills | Status: DC

## 2022-09-14 MED ORDER — ALBUTEROL SULFATE HFA 108 (90 BASE) MCG/ACT IN AERS: 1.0000 | INHALATION_SPRAY | Freq: Four times a day (QID) | RESPIRATORY_TRACT | 11 refills | Status: DC | PRN

## 2022-09-14 MED ORDER — LORAZEPAM 0.5 MG PO TABS: 0.5000 mg | ORAL_TABLET | Freq: Every day | ORAL | 1 refills | Status: DC | PRN
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
## 2022-09-14 NOTE — Patient Instructions (Addendum)
Return in about 24 weeks (around 03/01/2023) for cpe (20 min), Routine chronic condition follow-up.        Great to see you today.  I have refilled the medication(s) we provide.   If labs were collected, we will inform you of lab results once received either by echart message or telephone call.   - echart message- for normal results that have been seen by the patient already.   - telephone call: abnormal results or if patient has not viewed results in their echart.

## 2022-09-15 ENCOUNTER — Ambulatory Visit
Admission: RE | Admit: 2022-09-15 | Discharge: 2022-09-15 | Disposition: A | Discharge status: Home / Self Care | Payer: Medicare Other | Source: Ambulatory Visit | Attending: Family Medicine | Admitting: Family Medicine

## 2022-09-15 DIAGNOSIS — Z780 Asymptomatic menopausal state: Secondary | ICD-10-CM | POA: Diagnosis not present

## 2022-09-15 DIAGNOSIS — M816 Localized osteoporosis [Lequesne]: Secondary | ICD-10-CM

## 2022-09-15 DIAGNOSIS — M810 Age-related osteoporosis without current pathological fracture: Secondary | ICD-10-CM | POA: Diagnosis not present

## 2022-09-15 DIAGNOSIS — E559 Vitamin D deficiency, unspecified: Secondary | ICD-10-CM

## 2022-09-15 DIAGNOSIS — M85851 Other specified disorders of bone density and structure, right thigh: Secondary | ICD-10-CM | POA: Diagnosis not present

## 2022-09-16 ENCOUNTER — Ambulatory Visit
Admission: RE | Admit: 2022-09-16 | Discharge: 2022-09-16 | Disposition: A | Discharge status: Home / Self Care | Payer: Medicare Other | Source: Ambulatory Visit | Attending: Family Medicine | Admitting: Family Medicine

## 2022-09-16 ENCOUNTER — Telehealth: Payer: Self-pay | Admitting: Family Medicine

## 2022-09-16 DIAGNOSIS — Z1231 Encounter for screening mammogram for malignant neoplasm of breast: Secondary | ICD-10-CM

## 2022-09-16 NOTE — Telephone Encounter (Signed)
Please call patient: Her bone density has slightly improved since her last scan.  She still in the osteoporosis range, but there has been no additional bone loss.  Her score is now -3.1   Repeat scan in 2 years

## 2022-09-16 NOTE — Telephone Encounter (Signed)
Pt viewed on mychart

## 2022-09-28 DIAGNOSIS — H40023 Open angle with borderline findings, high risk, bilateral: Secondary | ICD-10-CM | POA: Diagnosis not present

## 2022-10-08 DIAGNOSIS — J069 Acute upper respiratory infection, unspecified: Secondary | ICD-10-CM | POA: Diagnosis not present

## 2022-10-08 DIAGNOSIS — Z6824 Body mass index (BMI) 24.0-24.9, adult: Secondary | ICD-10-CM | POA: Diagnosis not present

## 2022-10-08 DIAGNOSIS — J00 Acute nasopharyngitis [common cold]: Secondary | ICD-10-CM | POA: Diagnosis not present

## 2022-11-17 ENCOUNTER — Other Ambulatory Visit: Payer: Self-pay | Admitting: Family Medicine

## 2022-12-20 ENCOUNTER — Telehealth (INDEPENDENT_AMBULATORY_CARE_PROVIDER_SITE_OTHER): Payer: Medicare Other | Admitting: Nurse Practitioner

## 2022-12-20 ENCOUNTER — Encounter: Payer: Self-pay | Admitting: Nurse Practitioner

## 2022-12-20 DIAGNOSIS — U071 COVID-19: Secondary | ICD-10-CM | POA: Diagnosis not present

## 2022-12-20 MED ORDER — MOLNUPIRAVIR EUA 200MG CAPSULE: 4.0000 | ORAL_CAPSULE | Freq: Two times a day (BID) | ORAL | 0 refills | Status: DC

## 2022-12-20 MED ORDER — DEXTROMETHORPHAN-GUAIFENESIN 5-100 MG/5ML PO LIQD: 10.0000 mL | Freq: Every evening | ORAL | 0 refills | Status: DC | PRN

## 2022-12-20 NOTE — Progress Notes (Addendum)
Virtual Visit via Video Note  I connected with Crystal Horn by a video enabled telemedicine application and verified that I am speaking with the correct person using two identifiers.  Patient Location: Home Provider Location: Office/Clinic  I discussed the limitations, risks, security, and privacy concerns of performing an evaluation and management service by video and the availability of in person appointments. I also discussed with the patient that there may be a patient responsible charge related to this service. The patient expressed understanding and agreed to proceed.  Subjective: PCP: Natalia Leatherwood, DO  Chief Complaint  Patient presents with   Acute Visit    Dull headache around eyes  Tested Positive for Covid on 12/20/22   HPI . Crystal Horn has been seen  for positive home COVID test today.  She states that her daughter came back from [place] on Thursday and she was tested positive on Friday. This morning her husband had symptoms and he tested positive for COVID. Since husband was positive she checked tested herself.  Associated symptoms include headache, nasal congestion and cough.  Denies fever, body aches, diarrhea or SOB She has not taken anything for the symptoms.   ROS: Per HPI  Current Outpatient Medications:    albuterol (VENTOLIN HFA) 108 (90 Base) MCG/ACT inhaler, Inhale 1-2 puffs into the lungs every 6 (six) hours as needed for wheezing or shortness of breath., Disp: 6.7 each, Rfl: 11   alendronate (FOSAMAX) 70 MG tablet, Take 1 tablet (70 mg total) by mouth every 7 (seven) days. Take with a full glass of water on an empty stomach., Disp: 4 tablet, Rfl: 11   atorvastatin (LIPITOR) 20 MG tablet, Take 1 tablet (20 mg total) by mouth every evening., Disp: 90 tablet, Rfl: 3   beclomethasone (QVAR REDIHALER) 40 MCG/ACT inhaler, Inhale 1 puff into the lungs 2 (two) times daily., Disp: 1 each, Rfl: 5   Cholecalciferol (VITAMIN D3) 50 MCG (2000 UT) TABS,  Take by mouth., Disp: , Rfl:    Dextromethorphan-guaiFENesin 5-100 MG/5ML LIQD, Take 10 mLs by mouth at bedtime as needed (for cough)., Disp: 118 mL, Rfl: 0   loratadine (CLARITIN) 10 MG tablet, Take 1 tablet (10 mg total) by mouth daily., Disp: 90 tablet, Rfl: 3   LORazepam (ATIVAN) 0.5 MG tablet, Take 1 tablet (0.5 mg total) by mouth daily as needed., Disp: 90 tablet, Rfl: 1   molnupiravir EUA (LAGEVRIO) 200 mg CAPS capsule, Take 4 capsules (800 mg total) by mouth 2 (two) times daily for 5 days., Disp: 40 capsule, Rfl: 0   OMEPRAZOLE PO, Take by mouth., Disp: , Rfl:   Observations/Objective: There were no vitals filed for this visit. Physical Exam Constitutional:      General: She is not in acute distress.    Appearance: Normal appearance.  Pulmonary:     Effort: No respiratory distress.     Comments: Coughing while talking. Neurological:     Mental Status: She is alert and oriented to person, place, and time. Mental status is at baseline.  Psychiatric:        Speech: Speech normal.        Behavior: Behavior normal.        Thought Content: Thought content normal.     Assessment and Plan: Advised patient to do symptomatic treatment. Increase fluid intake and rest. Take the prescription medication as directed. Advised patient to seek emergent care if have trouble breathing or shortness of breath.  Follow Up Instructions: Return  if symptoms worsen or fail to improve.   I discussed the assessment and treatment plan with the patient. The patient was provided an opportunity to ask questions, and all were answered. The patient agreed with the plan and demonstrated an understanding of the instructions.   The patient was advised to call back or seek an in-person evaluation if the symptoms worsen or if the condition fails to improve as anticipated.  The above assessment and management plan was discussed with the patient. The patient verbalized understanding of and has agreed to the  management plan.   [name], NP

## 2022-12-20 NOTE — Progress Notes (Incomplete)
Virtual Visit via Video Note  I connected with Crystal Horn on 12/20/22 at 4:48 PM by a video enabled telemedicine application and verified that I am speaking with the correct person using two identifiers.  {Patient Location:[id number]::"Home"} {Provider [phone]::"Home Office"}  I discussed the limitations, risks, security, and privacy concerns of performing an evaluation and management service by video and the availability of in person appointments. I also discussed with the patient that there may be a patient responsible charge related to this service. The patient expressed understanding and agreed to proceed.  Subjective: PCP: Natalia Leatherwood, DO  Chief Complaint  Patient presents with  . Acute Visit    Dull headache around eyes  Tested Positive for Covid on 12/20/22   HPI . Crystal Horn has been seen  for positive home COVID test today.  She states that her daughter came back from [place] on Thursday and she was tested positive on Friday. This morning her husband had symptoms and he tested positive for COVID. Since husband was positive she checked tested herself.  Associated symptoms include headache, nasal congestion and cough.  Denies fever, body aches, diarrhea or SOB She has not taken anything for the symptoms.   ROS: Per HPI  Current Outpatient Medications:  .  albuterol (VENTOLIN HFA) 108 (90 Base) MCG/ACT inhaler, Inhale 1-2 puffs into the lungs every 6 (six) hours as needed for wheezing or shortness of breath., Disp: 6.7 each, Rfl: 11 .  alendronate (FOSAMAX) 70 MG tablet, Take 1 tablet (70 mg total) by mouth every 7 (seven) days. Take with a full glass of water on an empty stomach., Disp: 4 tablet, Rfl: 11 .  atorvastatin (LIPITOR) 20 MG tablet, Take 1 tablet (20 mg total) by mouth every evening., Disp: 90 tablet, Rfl: 3 .  beclomethasone (QVAR REDIHALER) 40 MCG/ACT inhaler, Inhale 1 puff into the lungs 2 (two) times daily., Disp: 1 each, Rfl:  5 .  Cholecalciferol (VITAMIN D3) 50 MCG (2000 UT) TABS, Take by mouth., Disp: , Rfl:  .  loratadine (CLARITIN) 10 MG tablet, Take 1 tablet (10 mg total) by mouth daily., Disp: 90 tablet, Rfl: 3 .  LORazepam (ATIVAN) 0.5 MG tablet, Take 1 tablet (0.5 mg total) by mouth daily as needed., Disp: 90 tablet, Rfl: 1 .  OMEPRAZOLE PO, Take by mouth., Disp: , Rfl:   Observations/Objective: There were no vitals filed for this visit. Physical Exam Constitutional:      General: She is not in acute distress.    Appearance: Normal appearance.  Pulmonary:     Effort: No respiratory distress.     Comments: Coughing while talking. Neurological:     Mental Status: She is alert and oriented to person, place, and time. Mental status is at baseline.  Psychiatric:        Speech: Speech normal.        Behavior: Behavior normal.        Thought Content: Thought content normal.     Assessment and Plan: Advised patient to do symptomatic treatment. Increase fluid intake and rest. Take the prescription medication as directed. Advised patient to seek emergent care if have trouble breathing or shortness of breath.  Follow Up Instructions: No follow-ups on file.   I discussed the assessment and treatment plan with the patient. The patient was provided an opportunity to ask questions, and all were answered. The patient agreed with the plan and demonstrated an understanding of the instructions.   The patient was advised  to call back or seek an in-person evaluation if the symptoms worsen or if the condition fails to improve as anticipated.  The above assessment and management plan was discussed with the patient. The patient verbalized understanding of and has agreed to the management plan.   [name], NP

## 2022-12-21 ENCOUNTER — Telehealth: Payer: Self-pay | Admitting: Family Medicine

## 2022-12-21 ENCOUNTER — Encounter: Payer: Self-pay | Admitting: Nurse Practitioner

## 2022-12-21 ENCOUNTER — Telehealth: Payer: Medicare Other | Admitting: Family Medicine

## 2022-12-21 MED ORDER — NIRMATRELVIR/RITONAVIR (PAXLOVID)TABLET: 3.0000 | ORAL_TABLET | Freq: Two times a day (BID) | ORAL | 0 refills | Status: AC

## 2022-12-21 NOTE — Telephone Encounter (Signed)
noted 

## 2022-12-21 NOTE — Telephone Encounter (Signed)
Called and LMOM to CB as well as sent pt a mychart msg with information

## 2022-12-21 NOTE — Telephone Encounter (Signed)
If lageviro is not covered,  then Paxlovid probably is no longer covered either,  but I have sent it to her pharmacy  She can use tylenol and motrin or tylenol and aleve in combination for body aches and fevers

## 2022-12-21 NOTE — Patient Instructions (Signed)
Take the medication as prescribed. Increase fluid intake and rest. Do symptomatic treatment.  Hope you feel better soon!  Molnupiravir Capsules What is this medication? MOLNUPIRAVIR (MOL nue PIR a vir) treats mild to moderate COVID-19. It may help people who are at high risk of developing severe illness. This medication works by limiting the spread of the virus in your body. The FDA has allowed the emergency use of this medication. This medicine may be used for other purposes; ask your health care provider or pharmacist if you have questions. COMMON BRAND NAME(S): LAGEVRIO What should I tell my care team before I take this medication? They need to know if you have any of these conditions: Any allergies Any serious illness An unusual or allergic reaction to molnupiravir, other medications, foods, dyes, or preservatives Pregnant or trying to get pregnant Breast-feeding How should I use this medication? Take this medication by mouth with water. Take it as directed on the prescription label at the same time every day. Do not cut, crush, or chew this medication. Swallow the capsules whole. You can take it with or without food. If it upsets your stomach, take it with food. Take all of it unless your care team tells you to stop it early. Keep taking it even if you think you are better. Talk to your care team about the use of this medication in children. Special care may be needed. Overdosage: If you think you have taken too much of this medicine contact a poison control center or emergency room at once. NOTE: This medicine is only for you. Do not share this medicine with others. What if I miss a dose? If you miss a dose, take it as soon as you can unless it is more than 10 hours late. If it is more than 10 hours late, skip the missed dose. Take the next dose at the normal time. Do not take extra or 2 doses at the same time to make up for the missed dose. What may interact with this  medication? Interactions have not been studied. This list may not describe all possible interactions. Give your health care provider a list of all the medicines, herbs, non-prescription drugs, or dietary supplements you use. Also tell them if you smoke, drink alcohol, or use illegal drugs. Some items may interact with your medicine. What should I watch for while using this medication? Your condition will be monitored carefully while you are receiving this medication. Visit your care team for regular checkups. Tell your care team if your symptoms do not start to get better or if they get worse. Do not become pregnant while taking this medication. You may need a pregnancy test before starting this medication. Women must use a reliable form of birth control while taking this medication and for 4 days after stopping the medication. Women should inform their care team if they wish to become pregnant or think they might be pregnant. Men should not father a child while taking this medication and for 3 months after stopping it. There is potential for serious harm to an unborn child. Talk to your care team for more information. Do not breast-feed an infant while taking this medication and for 4 days after stopping the medication. What side effects may I notice from receiving this medication? Side effects that you should report to your care team as soon as possible: Allergic reactions--skin rash, itching, hives, swelling of the face, lips, tongue, or throat Side effects that usually do not require medical  attention (report these to your care team if they continue or are bothersome): Diarrhea Dizziness Nausea This list may not describe all possible side effects. Call your doctor for medical advice about side effects. You may report side effects to FDA at 1-800-FDA-1088. Where should I keep my medication? Keep out of the reach of children and pets. Store at room temperature between 20 and 25 degrees C (68 and 77  degrees F). Get rid of any unused medication after the expiration date. To get rid of medications that are no longer needed or have expired: Take the medication to a medication take-back program. Check with your pharmacy or law enforcement to find a location. If you cannot return the medication, check the label or package insert to see if the medication should be thrown out in the garbage or flushed down the toilet. If you are not sure, ask your care team. If it is safe to put it in the trash, take the medication out of the container. Mix the medication with cat litter, dirt, coffee grounds, or other unwanted substance. Seal the mixture in a bag or container. Put it in the trash. NOTE: This sheet is a summary. It may not cover all possible information. If you have questions about this medicine, talk to your doctor, pharmacist, or health care provider.  2024 Elsevier/Gold Standard (2021-09-06 00:00:00)   Isolation Instructions: You have to isolate at home for 5 days from onset of your symptoms. If you must be around other household members who do not have symptoms, you need to make sure that both you and the family members are masking consistently with a high-quality mask.   After day 5 of isolation, if you have had no fever within 24 hours and you are feeling better, you can end isolation but need to mask for an additional 5 days.   After day 5 if you have a fever or are having significant symptoms, please isolate for full 10 days.   If you note any worsening of symptoms despite treatment, please seek an in-person evaluation ASAP. If you note any significant shortness of breath or any chest pain, please seek ER evaluation.

## 2022-12-21 NOTE — Telephone Encounter (Signed)
Pt called in stating that med LAGEVRIO its not cover by her insurance. And she was wondering if provider can change it to another one that her insurance can covers? Pt stated she feels miseable and would like to have med.

## 2022-12-21 NOTE — Telephone Encounter (Signed)
Patient called a few times today about her visit yesterday with [name]. She reports that the Dr. Prescribed something that would cost $330 and she could not afford that. She was seeking an alternative medication. Advised patient she would need to contact the provider who prescribed the medication. She later called back and stated that the provider was out of office. I informed the patient that Dr. Claiborne Billings was out of the office all week, but I could get her scheduled with another provider in [place] for a virtual visit to get another medication. She is scheduled with Zandra Abts for a virtual visit at 1:40

## 2023-03-01 ENCOUNTER — Ambulatory Visit (INDEPENDENT_AMBULATORY_CARE_PROVIDER_SITE_OTHER): Payer: Medicare Other | Admitting: Family Medicine

## 2023-03-01 ENCOUNTER — Encounter: Payer: Self-pay | Admitting: Family Medicine

## 2023-03-01 VITALS — BP 109/73 | HR 72 | Temp 98.0°F | Ht 62.0 in | Wt 132.8 lb

## 2023-03-01 DIAGNOSIS — E782 Mixed hyperlipidemia: Secondary | ICD-10-CM | POA: Diagnosis not present

## 2023-03-01 DIAGNOSIS — F419 Anxiety disorder, unspecified: Secondary | ICD-10-CM | POA: Diagnosis not present

## 2023-03-01 DIAGNOSIS — Z23 Encounter for immunization: Secondary | ICD-10-CM

## 2023-03-01 DIAGNOSIS — Z1231 Encounter for screening mammogram for malignant neoplasm of breast: Secondary | ICD-10-CM

## 2023-03-01 DIAGNOSIS — Z8249 Family history of ischemic heart disease and other diseases of the circulatory system: Secondary | ICD-10-CM

## 2023-03-01 DIAGNOSIS — Z0000 Encounter for general adult medical examination without abnormal findings: Secondary | ICD-10-CM | POA: Diagnosis not present

## 2023-03-01 DIAGNOSIS — E559 Vitamin D deficiency, unspecified: Secondary | ICD-10-CM

## 2023-03-01 DIAGNOSIS — Z79899 Other long term (current) drug therapy: Secondary | ICD-10-CM | POA: Diagnosis not present

## 2023-03-01 DIAGNOSIS — B380 Acute pulmonary coccidioidomycosis: Secondary | ICD-10-CM

## 2023-03-01 DIAGNOSIS — K219 Gastro-esophageal reflux disease without esophagitis: Secondary | ICD-10-CM | POA: Diagnosis not present

## 2023-03-01 DIAGNOSIS — Z5181 Encounter for therapeutic drug level monitoring: Secondary | ICD-10-CM | POA: Diagnosis not present

## 2023-03-01 DIAGNOSIS — M816 Localized osteoporosis [Lequesne]: Secondary | ICD-10-CM

## 2023-03-01 LAB — MAGNESIUM: Magnesium: 2 mg/dL (ref 1.5–2.5)

## 2023-03-01 LAB — COMPREHENSIVE METABOLIC PANEL
ALT: 16 U/L (ref 0–35)
AST: 17 U/L (ref 0–37)
Albumin: 4.3 g/dL (ref 3.5–5.2)
Alkaline Phosphatase: 61 U/L (ref 39–117)
BUN: 15 mg/dL (ref 6–23)
CO2: 26 mEq/L (ref 19–32)
Calcium: 9.2 mg/dL (ref 8.4–10.5)
Chloride: 104 mEq/L (ref 96–112)
Creatinine, Ser: 0.62 mg/dL (ref 0.40–1.20)
GFR: 90.36 mL/min (ref >60.00)
Glucose, Bld: 91 mg/dL (ref 70–99)
Potassium: 3.8 mEq/L (ref 3.5–5.1)
Sodium: 139 mEq/L (ref 135–145)
Total Bilirubin: 0.6 mg/dL (ref 0.2–1.2)
Total Protein: 6.4 g/dL (ref 6.0–8.3)

## 2023-03-01 LAB — CBC WITH DIFFERENTIAL/PLATELET
Basophils Absolute: 0 10*3/uL (ref 0.0–0.1)
Basophils Relative: 0.5 % (ref 0.0–3.0)
Eosinophils Absolute: 0.1 10*3/uL (ref 0.0–0.7)
Eosinophils Relative: 1.6 % (ref 0.0–5.0)
HCT: 41.3 % (ref 36.0–46.0)
Hemoglobin: 13.6 g/dL (ref 12.0–15.0)
Lymphocytes Relative: 26.8 % (ref 12.0–46.0)
Lymphs Abs: 1.5 10*3/uL (ref 0.7–4.0)
MCHC: 32.9 g/dL (ref 30.0–36.0)
MCV: 90.6 fl (ref 78.0–100.0)
Monocytes Absolute: 0.5 10*3/uL (ref 0.1–1.0)
Monocytes Relative: 8.8 % (ref 3.0–12.0)
Neutro Abs: 3.6 10*3/uL (ref 1.4–7.7)
Neutrophils Relative %: 62.3 % (ref 43.0–77.0)
Platelets: 194 10*3/uL (ref 150.0–400.0)
RBC: 4.55 Mil/uL (ref 3.87–5.11)
RDW: 13.2 % (ref 11.5–15.5)
WBC: 5.7 10*3/uL (ref 4.0–10.5)

## 2023-03-01 LAB — LIPID PANEL
Cholesterol: 194 mg/dL (ref 0–200)
HDL: 64.8 mg/dL (ref >39.00)
LDL Cholesterol: 112 mg/dL — ABNORMAL HIGH (ref 0–99)
NonHDL: 128.99
Total CHOL/HDL Ratio: 3
Triglycerides: 85 mg/dL (ref 0.0–149.0)
VLDL: 17 mg/dL (ref 0.0–40.0)

## 2023-03-01 LAB — HEMOGLOBIN A1C: Hgb A1c MFr Bld: 6 % (ref 4.6–6.5)

## 2023-03-01 LAB — VITAMIN D 25 HYDROXY (VIT D DEFICIENCY, FRACTURES): VITD: 28.96 ng/mL — ABNORMAL LOW (ref 30.00–100.00)

## 2023-03-01 LAB — TSH: TSH: 2.08 u[IU]/mL (ref 0.35–5.50)

## 2023-03-01 LAB — VITAMIN B12: Vitamin B-12: 237 pg/mL (ref 211–911)

## 2023-03-01 MED ORDER — LORATADINE 10 MG PO TABS: 10.0000 mg | ORAL_TABLET | Freq: Every day | ORAL | 3 refills | Status: DC

## 2023-03-01 MED ORDER — ALENDRONATE SODIUM 70 MG PO TABS: 70.0000 mg | ORAL_TABLET | ORAL | 11 refills | Status: DC

## 2023-03-01 MED ORDER — ATORVASTATIN CALCIUM 20 MG PO TABS: 20.0000 mg | ORAL_TABLET | Freq: Every evening | ORAL | 3 refills | Status: DC

## 2023-03-01 MED ORDER — ZOSTER VAC RECOMB ADJUVANTED 50 MCG/0.5ML IM SUSR: 0.5000 mL | Freq: Once | INTRAMUSCULAR | 1 refills | Status: AC

## 2023-03-01 MED ORDER — LORAZEPAM 0.5 MG PO TABS: 0.5000 mg | ORAL_TABLET | Freq: Every day | ORAL | 1 refills | Status: DC | PRN

## 2023-03-01 MED ORDER — TETANUS-DIPHTH-ACELL PERTUSSIS 5-2.5-18.5 LF-MCG/0.5 IM SUSP: 0.5000 mL | Freq: Once | INTRAMUSCULAR | 0 refills | Status: AC

## 2023-03-01 NOTE — Patient Instructions (Addendum)
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
## 2023-03-02 ENCOUNTER — Telehealth: Payer: Self-pay | Admitting: Family Medicine

## 2023-03-02 DIAGNOSIS — E559 Vitamin D deficiency, unspecified: Secondary | ICD-10-CM

## 2023-03-02 DIAGNOSIS — E538 Deficiency of other specified B group vitamins: Secondary | ICD-10-CM | POA: Insufficient documentation

## 2023-03-02 NOTE — Telephone Encounter (Signed)
Please call patient Crystal Horn, Crystal Horn function are normal Blood cell counts, magnesium and electrolytes are normal Diabetes screening/A1c is stable, but still in the prediabetic range.  Routine exercise, high fiber diet, low in sugar will help stabilize A1c. Cholesterol panel at goal. Vitamin D and B12 levels are both low.   -Vitamin D is just mildly low at 28.9, I would encourage her to add 1000 units to her current vitamin D supplement   -B12 is significantly low at 237.       -I would encourage her to start over-the-counter B12 sublingual solution of 1000 mcg daily.  This must be this format of sublingual/under the tongue in order for her to absorb appropriately.       -I also recommend we schedule her to have B12 injections (in the office) once every 2 weeks for 4 doses.  This will get her B12 into normal range and then the sublingual solution daily should help keep it in normal range.

## 2023-03-07 NOTE — Telephone Encounter (Signed)
Ok to use higher dose b12, but only use 3x a week, spacing doses out by a day

## 2023-03-07 NOTE — Telephone Encounter (Signed)
Patient is inquiring about the B12 her husband bought   which is quick dissolve and 2,500. She is asking if she would be able to cut the pill in half. Please give the patient a call to discuss options.

## 2023-03-08 NOTE — Telephone Encounter (Signed)
Pt advised.

## 2023-03-10 ENCOUNTER — Ambulatory Visit (INDEPENDENT_AMBULATORY_CARE_PROVIDER_SITE_OTHER): Payer: Medicare Other

## 2023-03-10 ENCOUNTER — Ambulatory Visit: Payer: Medicare Other

## 2023-03-10 DIAGNOSIS — H40023 Open angle with borderline findings, high risk, bilateral: Secondary | ICD-10-CM | POA: Diagnosis not present

## 2023-03-10 DIAGNOSIS — E538 Deficiency of other specified B group vitamins: Secondary | ICD-10-CM | POA: Diagnosis not present

## 2023-03-10 DIAGNOSIS — H524 Presbyopia: Secondary | ICD-10-CM | POA: Diagnosis not present

## 2023-03-10 DIAGNOSIS — H43813 Vitreous degeneration, bilateral: Secondary | ICD-10-CM | POA: Diagnosis not present

## 2023-03-10 MED ORDER — CYANOCOBALAMIN 1000 MCG/ML IJ SOLN
1000.0000 ug | Freq: Once | INTRAMUSCULAR | Status: AC
  Administered 2023-03-10: 1000 ug via INTRAMUSCULAR

## 2023-03-10 NOTE — Progress Notes (Signed)
Pt here for 1/4 biweekly b12 injections per Dr.Kuneff ( B12 injections (in the office) once every 2 weeks for 4 doses. )  B12 given IM right deltoid, and pt tolerated injection well.  Next B12 injection not scheduled, pt should schedule for 2 weeks.

## 2023-03-12 DIAGNOSIS — N3000 Acute cystitis without hematuria: Secondary | ICD-10-CM | POA: Diagnosis not present

## 2023-03-12 DIAGNOSIS — R300 Dysuria: Secondary | ICD-10-CM | POA: Diagnosis not present

## 2023-03-22 NOTE — Addendum Note (Signed)
Addended by: Filomena Jungling on: 03/22/2023 08:35 AM   Modules accepted: Orders

## 2023-03-24 NOTE — Addendum Note (Signed)
Addended by: Filomena Jungling on: 03/24/2023 08:34 AM   Modules accepted: Orders

## 2023-04-05 ENCOUNTER — Telehealth: Payer: Self-pay | Admitting: Family Medicine

## 2023-04-05 ENCOUNTER — Ambulatory Visit: Payer: Medicare Other | Admitting: Urology

## 2023-04-05 NOTE — Telephone Encounter (Signed)
Patient reports that she is having symptoms of a UTI. She is requesting that the same medication be called in that was called in previously for her same symptoms. She is in pain and states the symptoms began on Monday, and she is aware of how it started based on traveling across country in a vehicle and not drinking water etc. However, the symptoms she is experiencing is pressure while urinating and pain. Patient decline appointment for Friday. Please advise patient if something can be called in to relief her symptoms.

## 2023-04-05 NOTE — Telephone Encounter (Signed)
Spoke with patient regarding results/recommendations.  

## 2023-04-07 ENCOUNTER — Ambulatory Visit: Payer: Medicare Other | Admitting: Family Medicine

## 2023-04-19 ENCOUNTER — Ambulatory Visit (HOSPITAL_BASED_OUTPATIENT_CLINIC_OR_DEPARTMENT_OTHER): Payer: Medicare Other

## 2023-05-17 ENCOUNTER — Ambulatory Visit (HOSPITAL_BASED_OUTPATIENT_CLINIC_OR_DEPARTMENT_OTHER): Admission: RE | Admit: 2023-05-17 | Payer: Medicare Other | Source: Ambulatory Visit

## 2023-05-26 ENCOUNTER — Other Ambulatory Visit: Payer: Self-pay | Admitting: Family Medicine

## 2023-05-30 ENCOUNTER — Encounter: Payer: Self-pay | Admitting: Family Medicine

## 2023-05-30 ENCOUNTER — Ambulatory Visit (INDEPENDENT_AMBULATORY_CARE_PROVIDER_SITE_OTHER): Payer: Medicare Other | Admitting: Family Medicine

## 2023-05-30 VITALS — BP 128/78 | HR 97 | Temp 98.5°F | Wt 134.2 lb

## 2023-05-30 DIAGNOSIS — R110 Nausea: Secondary | ICD-10-CM

## 2023-05-30 DIAGNOSIS — R051 Acute cough: Secondary | ICD-10-CM

## 2023-05-30 DIAGNOSIS — B9789 Other viral agents as the cause of diseases classified elsewhere: Secondary | ICD-10-CM

## 2023-05-30 DIAGNOSIS — J988 Other specified respiratory disorders: Secondary | ICD-10-CM | POA: Diagnosis not present

## 2023-05-30 DIAGNOSIS — B380 Acute pulmonary coccidioidomycosis: Secondary | ICD-10-CM

## 2023-05-30 LAB — POCT INFLUENZA A/B
Influenza A, POC: NEGATIVE
Influenza B, POC: NEGATIVE

## 2023-05-30 LAB — POC COVID19 BINAXNOW: SARS Coronavirus 2 Ag: NEGATIVE

## 2023-05-30 MED ORDER — ONDANSETRON HCL 4 MG PO TABS: 4.0000 mg | ORAL_TABLET | Freq: Three times a day (TID) | ORAL | 0 refills | Status: DC | PRN

## 2023-05-30 MED ORDER — AZITHROMYCIN 250 MG PO TABS: ORAL_TABLET | ORAL | 0 refills | Status: AC
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
## 2023-05-30 NOTE — Patient Instructions (Signed)
Adenovirus Infection, Adult  Adenoviruses are common viruses that cause many types of infections. These viruses may affect the nose, throat, windpipe, and lungs (respiratory system). They can also affect the eyes, stomach, bowels, bladder, and brain. The most common type of adenovirus infection is the common cold.  Most adenovirus infections are not severe. However, they can become severe in people who have another health problem that makes it hard to fight off infection.  What are the causes?  This condition is caused by an adenovirus entering your body. This can happen when:  You touch a surface or object that has the virus on it and then touch your mouth, nose, or eyes with unwashed hands.  You come in close physical contact with someone who has this type of infection. This may happen if you hug or shake hands with the person.  You breathe in droplets that fly through the air when someone who has the infection talks, coughs, or sneezes.  You have contact with stool (feces) that has the virus in it.  You use a swimming pool that does not have enough chlorine in it. Chlorine is a chemical that kills germs.  Adenoviruses can live outside the body for a long time. They spread easily from person to person (are contagious).  What increases the risk?  You are more likely to develop this condition if:  You spend a lot of time in places where there are many people. These include schools, summer camps, day care centers, community centers, and training centers for people who join the [employer].  You are an older adult.  Your body has a weak defense system (immune system).  You have a disease of the respiratory system.  You have a heart condition.  What are the signs or symptoms?  Symptoms of this condition can take up to 14 days to appear. They are usually similar to symptoms of the flu.  Common symptoms of this condition include:  Lung and breathing problems, such as cough, trouble breathing, and stuffy (congested) or runny  nose.  Aches and pains, such as headache, stiff neck, sore throat, ear pain, congested ears, or stomachache.  Nausea and vomiting.  Diarrhea.  Fever.  Eye problems, such as a red, inflamed eye (pink eye or conjunctivitis).  Rash.  Less common symptoms include:  Being confused or not knowing the time of day, where you are, or who you are (disoriented).  Blood in your urine or pain when you urinate.  How is this diagnosed?  This condition may be diagnosed based on your symptoms and a physical exam. Your health care provider may order tests to make sure that your symptoms are not caused by another problem. Tests can include:  Blood tests.  Urine tests.  Stool tests.  Chest X-ray.  Tests of tissue or mucus from your throat.  How is this treated?  This condition goes away on its own with time. Symptoms can be managed by:  Getting plenty of rest.  Drinking more fluids than usual.  Taking over-the-counter medicine to help relieve a sore throat, fever, or headache.  Follow these instructions at home:    Activity  Rest at home until your symptoms go away.  Return to your normal activities as told by your health care provider. Ask your health care provider what activities are safe for you.  Lifestyle  Do not drink alcohol.  Do not use any products that contain nicotine or tobacco. These products include cigarettes, chewing tobacco, and vaping  devices, such as e-cigarettes. If you need help quitting, ask your health care provider.  General instructions  Drink enough fluid to keep your urine pale yellow.  Take over-the-counter and prescription medicines only as told by your health care provider.  If you have a sore throat, gargle with a mixture of salt and water 3-4 times a day or as needed. To make salt water, completely dissolve -1 tsp (3-6 g) of salt in 1 cup (237 mL) of warm water.  How is this prevented?         Adenoviruses often are not killed by common cleaning products. They also can stay on surfaces for a long time.  To help prevent infection:  Wash your hands often with soap and water for at least 20 seconds. If soap and water are not available, use hand sanitizer.  Cover your mouth when you cough. Cover your nose and mouth when you sneeze.  Do not touch your eyes, nose, or mouth with unwashed hands. Wash your hands after touching your face.  Clean commonly used objects often.  Do not use a swimming pool that does not have enough chlorine in it.  Avoid close contact with people who are sick.  Do not go to school or work when you are sick.  Do not share cups or eating utensils.  Where to find more information  Centers for Disease Control and Prevention: [employer]  Contact a health care provider if:  Your symptoms stay the same after 10 days.  Your symptoms get worse.  You cannot eat or drink without vomiting.  Get help right away if:  You have trouble breathing or you are breathing fast.  Your skin, lips, or fingernails look blue.  You have fast or irregular heartbeats (palpitations).  You become confused.  You lose consciousness.  These symptoms may be an emergency. Get help right away. Call 911.  Do not wait to see if the symptoms will go away.  Do not drive yourself to the hospital.  Summary  The most common type of adenovirus infection is the common cold.  Adenoviruses can live outside the body for a long time. They spread easily from person to person (are contagious).  This condition goes away on its own with time.  Rest at home until your symptoms go away.  Contact a health care provider if your symptoms stay the same after 10 days or get worse.  This information is not intended to replace advice given to you by your health care provider. Make sure you discuss any questions you have with your health care provider.  Document Revised: 10/11/2021 Document Reviewed: 10/11/2021  Elsevier Patient Education  2024 [employer].

## 2023-06-02 ENCOUNTER — Ambulatory Visit: Payer: Medicare Other | Admitting: Urology

## 2023-07-05 ENCOUNTER — Ambulatory Visit: Payer: Medicare Other

## 2023-08-10 DIAGNOSIS — H524 Presbyopia: Secondary | ICD-10-CM | POA: Diagnosis not present

## 2023-08-10 DIAGNOSIS — H26492 Other secondary cataract, left eye: Secondary | ICD-10-CM | POA: Diagnosis not present

## 2023-08-10 DIAGNOSIS — H40023 Open angle with borderline findings, high risk, bilateral: Secondary | ICD-10-CM | POA: Diagnosis not present

## 2023-08-10 DIAGNOSIS — H35372 Puckering of macula, left eye: Secondary | ICD-10-CM | POA: Diagnosis not present

## 2023-08-10 DIAGNOSIS — H43813 Vitreous degeneration, bilateral: Secondary | ICD-10-CM | POA: Diagnosis not present

## 2023-08-16 ENCOUNTER — Ambulatory Visit: Payer: Medicare Other | Admitting: Family Medicine

## 2023-08-16 ENCOUNTER — Telehealth: Payer: Self-pay

## 2023-08-16 NOTE — Telephone Encounter (Signed)
 error

## 2023-08-18 ENCOUNTER — Ambulatory Visit: Payer: Medicare Other | Admitting: Urology

## 2023-08-22 ENCOUNTER — Ambulatory Visit (INDEPENDENT_AMBULATORY_CARE_PROVIDER_SITE_OTHER): Payer: Medicare Other | Admitting: Family Medicine

## 2023-08-22 DIAGNOSIS — E782 Mixed hyperlipidemia: Secondary | ICD-10-CM

## 2023-08-22 NOTE — Progress Notes (Signed)
   Cancel per pt

## 2023-08-29 ENCOUNTER — Ambulatory Visit: Payer: Medicare Other | Admitting: Family Medicine

## 2023-08-29 ENCOUNTER — Encounter: Payer: Self-pay | Admitting: Family Medicine

## 2023-08-29 VITALS — BP 130/72 | HR 70 | Temp 98.0°F | Wt 135.4 lb

## 2023-08-29 DIAGNOSIS — E782 Mixed hyperlipidemia: Secondary | ICD-10-CM | POA: Diagnosis not present

## 2023-08-29 DIAGNOSIS — M816 Localized osteoporosis [Lequesne]: Secondary | ICD-10-CM

## 2023-08-29 DIAGNOSIS — B380 Acute pulmonary coccidioidomycosis: Secondary | ICD-10-CM | POA: Diagnosis not present

## 2023-08-29 DIAGNOSIS — K219 Gastro-esophageal reflux disease without esophagitis: Secondary | ICD-10-CM | POA: Diagnosis not present

## 2023-08-29 DIAGNOSIS — F419 Anxiety disorder, unspecified: Secondary | ICD-10-CM | POA: Diagnosis not present

## 2023-08-29 DIAGNOSIS — F32A Depression, unspecified: Secondary | ICD-10-CM

## 2023-08-29 DIAGNOSIS — Z8249 Family history of ischemic heart disease and other diseases of the circulatory system: Secondary | ICD-10-CM

## 2023-08-29 DIAGNOSIS — Z87891 Personal history of nicotine dependence: Secondary | ICD-10-CM

## 2023-08-29 MED ORDER — ESCITALOPRAM OXALATE 5 MG PO TABS: 5.0000 mg | ORAL_TABLET | Freq: Every day | ORAL | 1 refills | Status: DC

## 2023-08-29 MED ORDER — ALENDRONATE SODIUM 70 MG PO TABS: 70.0000 mg | ORAL_TABLET | ORAL | 11 refills | Status: DC

## 2023-08-29 MED ORDER — ALBUTEROL SULFATE HFA 108 (90 BASE) MCG/ACT IN AERS: 1.0000 | INHALATION_SPRAY | Freq: Four times a day (QID) | RESPIRATORY_TRACT | 11 refills | Status: DC | PRN

## 2023-08-29 MED ORDER — ONDANSETRON HCL 4 MG PO TABS: 4.0000 mg | ORAL_TABLET | Freq: Three times a day (TID) | ORAL | 3 refills | Status: DC | PRN

## 2023-08-29 MED ORDER — QVAR REDIHALER 40 MCG/ACT IN AERB: 1.0000 | INHALATION_SPRAY | Freq: Two times a day (BID) | RESPIRATORY_TRACT | 5 refills | Status: DC

## 2023-08-29 MED ORDER — OMEPRAZOLE 20 MG PO CPDR: 20.0000 mg | DELAYED_RELEASE_CAPSULE | Freq: Every day | ORAL | 2 refills | Status: DC

## 2023-08-29 MED ORDER — ATORVASTATIN CALCIUM 20 MG PO TABS: 20.0000 mg | ORAL_TABLET | Freq: Every evening | ORAL | 3 refills | Status: DC

## 2023-08-29 MED ORDER — LORATADINE 10 MG PO TABS: 10.0000 mg | ORAL_TABLET | Freq: Every day | ORAL | 3 refills | Status: DC

## 2023-08-29 MED ORDER — LORAZEPAM 0.5 MG PO TABS: 0.5000 mg | ORAL_TABLET | Freq: Every day | ORAL | 1 refills | Status: DC | PRN

## 2023-08-29 NOTE — Addendum Note (Signed)
Addended by: Felix Pacini A on: 08/29/2023 11:57 AM   Modules accepted: Orders
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
## 2023-08-29 NOTE — Addendum Note (Signed)
Addended by: Felix Pacini A on: 08/29/2023 12:06 PM   Modules accepted: Orders

## 2023-08-29 NOTE — Patient Instructions (Signed)

## 2023-08-29 NOTE — Addendum Note (Signed)
Addended by: Felix Pacini A on: 08/29/2023 11:56 AM   Modules accepted: Orders, Level of Service

## 2023-09-11 ENCOUNTER — Encounter: Payer: Self-pay | Admitting: Family Medicine

## 2023-09-11 ENCOUNTER — Telehealth (INDEPENDENT_AMBULATORY_CARE_PROVIDER_SITE_OTHER): Payer: Medicare Other | Admitting: Family Medicine

## 2023-09-11 VITALS — Temp 100.9°F

## 2023-09-11 DIAGNOSIS — U071 COVID-19: Secondary | ICD-10-CM | POA: Diagnosis not present

## 2023-09-11 MED ORDER — PROMETHAZINE-DM 6.25-15 MG/5ML PO SYRP: 5.0000 mL | ORAL_SOLUTION | Freq: Four times a day (QID) | ORAL | 0 refills | Status: DC | PRN

## 2023-09-11 MED ORDER — AZITHROMYCIN 250 MG PO TABS
ORAL_TABLET | ORAL | 0 refills | Status: AC
Start: 2023-09-11 — End: 2023-09-16
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
## 2023-09-13 ENCOUNTER — Ambulatory Visit: Payer: Self-pay | Admitting: Family Medicine

## 2023-09-13 NOTE — Telephone Encounter (Signed)
 Copied From CRM [id number]. Reason for Triage: Patient states she is having harsh symptoms from Paxlovid prescribed by [facility] - symptoms include diarrhea, nausea and headache.   Chief Complaint: abd pain, headache, cough (wheeze, non productive), diarrhea Symptoms: see above Frequency: constant Pertinent Negatives: Patient denies fever, sob, cp Disposition: [] ED /[] Urgent Care (no appt availability in office) / [] Appointment(In office/virtual)/ []  [place] [facility]/ [] Home Care/ [] Refused Recommended Disposition /[] [facility]/ [x]  Follow-up with PCP Additional Notes: Patient was seen and started on Paxlovid.  States pcp was going to call her in a zpac due to lung issues.  Office updated and to call patient back. Care advice given and instructed to go to er if becomes worse.   Reason for Disposition  [1] Mild diarrhea (e.g., 1-3 or more stools than normal in past 24 hours) without known cause AND [2] present >  7 days  Answer Assessment - Initial Assessment Questions 1. DIARRHEA SEVERITY: "How bad is the diarrhea?" "How many more stools have you had in the past 24 hours than normal?"    - NO DIARRHEA (SCALE 0)   - MILD (SCALE 1-3): Few loose or mushy BMs; increase of 1-3 stools over normal daily number of stools; mild increase in ostomy output.   -  MODERATE (SCALE 4-7): Increase of 4-6 stools daily over normal; moderate increase in ostomy output.   -  SEVERE (SCALE 8-10; OR "WORST POSSIBLE"): Increase of 7 or more stools daily over normal; moderate increase in ostomy output; incontinence.     2 times today 2. ONSET: "When did the diarrhea begin?"      yesterday 3. BM CONSISTENCY: "How loose or watery is the diarrhea?"      loose 4. VOMITING: "Are you also vomiting?" If Yes, ask: "How many times in the past 24 hours?"      denies 5. ABDOMEN PAIN: "Are you having any abdomen pain?" If Yes, ask: "What does it feel like?" (e.g., crampy, dull, intermittent, constant)       States gurgling 6. ABDOMEN PAIN SEVERITY: If present, ask: "How bad is the pain?"  (e.g., Scale 1-10; mild, moderate, or severe)   - MILD (1-3): doesn't interfere with normal activities, abdomen soft and not tender to touch    - MODERATE (4-7): interferes with normal activities or awakens from sleep, abdomen tender to touch    - SEVERE (8-10): excruciating pain, doubled over, unable to do any normal activities       "Just sore" 7. ORAL INTAKE: If vomiting, "Have you been able to drink liquids?" "How much liquids have you had in the past 24 hours?"     Hasn't been eating a lot but is drinking fluids 8. HYDRATION: "Any signs of dehydration?" (e.g., dry mouth [not just dry lips], too weak to stand, dizziness, new weight loss) "When did you last urinate?"     Mouth is dry at hs when she sleeps 9. EXPOSURE: "Have you traveled to a foreign country recently?" "Have you been exposed to anyone with diarrhea?" "Could you have eaten any food that was spoiled?"     na 10. ANTIBIOTIC USE: "Are you taking antibiotics now or have you taken antibiotics in the past 2 months?"       Currently on Paxlovid 11. OTHER SYMPTOMS: "Do you have any other symptoms?" (e.g., fever, blood in stool)       denies 12. PREGNANCY: "Is there any chance you are pregnant?" "When was your last menstrual period?"  Denies.  Protocols used: [facility]

## 2023-09-14 ENCOUNTER — Ambulatory Visit: Payer: Medicare Other | Admitting: Urgent Care

## 2023-09-15 ENCOUNTER — Ambulatory Visit (INDEPENDENT_AMBULATORY_CARE_PROVIDER_SITE_OTHER): Payer: Medicare Other | Admitting: Family Medicine

## 2023-09-15 ENCOUNTER — Telehealth: Payer: Self-pay

## 2023-09-15 ENCOUNTER — Encounter: Payer: Self-pay | Admitting: Family Medicine

## 2023-09-15 VITALS — BP 113/77 | HR 85 | Temp 98.1°F | Wt 136.0 lb

## 2023-09-15 DIAGNOSIS — U071 COVID-19: Secondary | ICD-10-CM | POA: Diagnosis not present

## 2023-09-15 MED ORDER — METHYLPREDNISOLONE ACETATE 80 MG/ML IJ SUSP
80.0000 mg | Freq: Once | INTRAMUSCULAR | Status: AC
  Administered 2023-09-15: 80 mg via INTRAMUSCULAR

## 2023-09-15 NOTE — Telephone Encounter (Signed)
 Please send to pharmacy for assistance

## 2023-09-15 NOTE — Telephone Encounter (Signed)
 Please advise   Copied from CRM [id number]. Topic: Clinical - Prescription Issue >> Sep 15, 2023 12:37 PM Corin V wrote: Reason for CRM: Patient called in regarding her "Qvar" inhaler Dr. Claiborne Billings was going to prescribe. It is $250 with insurance with discounts. She wants to know if there are other discounts or assistance programs available or if Dr,. Claiborne Billings has alternate medication recommendations available.
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
## 2023-09-15 NOTE — Patient Instructions (Addendum)
 Start qvar 2 puffs twice a day. Start z-pack and finish paxlovid.  Albuterol inhaler if noting any wheezing or chest tightness.   Return in about 10 days (around 09/25/2023).        Great to see you today.  I have refilled the medication(s) we provide.   If labs were collected or images ordered, we will inform you of  results once we have received them and reviewed. We will contact you either by echart message, or telephone call.  Please give ample time to the testing facility, and our office to run,  receive and review results. Please do not call inquiring of results, even if you can see them in your chart. We will contact you as soon as we are able. If it has been over 1 week since the test was completed, and you have not yet heard from [place], then please call us.    - echart message- for normal results that have been seen by the patient already.   - telephone call: abnormal results or if patient has not viewed results in their echart.  If a referral to a specialist was entered for you, please call us in 2 weeks if you have not heard from the specialist office to schedule.    COVID-19 COVID-19 is an infection caused by a virus called SARS-CoV-2. This type of virus is called a coronavirus. People with COVID-19 may: Have little to no symptoms. Have mild to moderate symptoms that affect their lungs and breathing. Get very sick. What are the causes? COVID-19 is caused by a virus. This virus may be in the air as droplets or on surfaces. It can spread from an infected person when they cough, sneeze, speak, sing, or breathe. You may become infected if: You breathe in the infected droplets in the air. You touch an object that has the virus on it. What increases the risk? You are at risk of getting COVID-19 if you have been around someone with the infection. You may be more likely to get very sick if: You are 51 years old or older. You have certain medical conditions, such as: Heart  disease. Diabetes. Chronic respiratory disease. Cancer. Pregnancy. You are immunocompromised. This means your body cannot fight infections easily. You have a disability or trouble moving, meaning you're immobile. What are the signs or symptoms? People may have different symptoms from COVID-19. The symptoms can also be mild to severe. They often show up in 5-6 days after being infected. But they can take up to 14 days to appear. Common symptoms are: Cough. Feeling tired. New loss of taste or smell. Fever. Less common symptoms are: Sore throat. Headache. Body or muscle aches. Diarrhea. A skin rash or odd-colored fingers or toes. Red or irritated eyes. Sometimes, COVID-19 does not cause symptoms. How is this diagnosed? COVID-19 can be diagnosed with tests done in the lab or at home. Fluid from your nose, mouth, or lungs will be used to check for the virus. How is this treated? Treatment for COVID-19 depends on how sick you are. Mild symptoms can be treated at home with rest, fluids, and over-the-counter medicines. Severe symptoms may be treated in a hospital intensive care unit (ICU). If you have symptoms and are at risk of getting very sick, you may be given a medicine that fights viruses. This medicine is called an antiviral. How is this prevented? To protect yourself from COVID-19: Know your risk factors. Get vaccinated. If your body cannot fight infections easily, talk  to your provider about treatment to help prevent COVID-19. Stay at least 1 meter away from others. Wear a well-fitted mask when: You can't stay at a distance from people. You're in a place with poor air flow. Try to be in open spaces with good air flow when in public. Wash your hands often or use an alcohol-based hand sanitizer. Cover your nose and mouth when coughing and sneezing. If you think you have COVID-19 or have been around someone who has it, stay home and be by yourself for 5-10 days. Where to find  more information Centers for Disease Control and Prevention (CDC): [employer]er[employer].no World Health Organization [facility]): VisitDestination.com.br Get help right away if: You have trouble breathing or get short of breath. You have pain or pressure in your chest. You cannot speak or move any part of your body. You are confused. Your symptoms get worse. These symptoms may be an emergency. Get help right away. Call 911. Do not wait to see if the symptoms will go away. Do not drive yourself to the hospital. This information is not intended to replace advice given to you by your health care provider. Make sure you discuss any questions you have with your health care provider. Document Revised: 07/15/2022 Document Reviewed: 03/25/2022 Elsevier Patient Education  2024 [employer].

## 2023-09-18 ENCOUNTER — Encounter: Payer: Self-pay | Admitting: Family Medicine

## 2023-09-18 NOTE — Telephone Encounter (Signed)
 LVM to discuss. Sent MyChart.

## 2023-09-18 NOTE — Telephone Encounter (Signed)
 The azithromycin is only 5 days of medicine.  I would recommend she continue the Z-Pak as directed, since she should always be done with that by now.  Make sure to take it with some food.  She has tolerated this in the past and the stomach upset is likely secondary to her COVID and will happen with any antibiotic.

## 2023-09-18 NOTE — Telephone Encounter (Signed)
 Reason for CRM: Patient is taking azithromycin 250 MG tablet and states that it is messing up her stomach. Patient is requesting another antibiotic   Please review & advise.

## 2023-09-19 MED ORDER — CEFDINIR 300 MG PO CAPS: 300.0000 mg | ORAL_CAPSULE | Freq: Two times a day (BID) | ORAL | 0 refills | Status: DC

## 2023-09-19 NOTE — Telephone Encounter (Signed)
 Please inform patient most of the inhalers are expensive. I am not certain which inhaler is on her formulary. I had asked that this be forwarded to the: Pharmacist to see if they can figure out what is more affordable for patient.   -Did this get referred to the pharmacy team?    -If it did, please let the patient know we are working on it and it is not an easy fix, we need to be able to have our pharmacist review to see what is more affordable for patient and if there is any assistance programs available for the inhaler.   -The patient can call her insurance and find out what inhaler  in the same class is on her formulary and affordable for her, then let us know.   If this has not been reported to the pharmacy team please do so.  As for the antibiotic, I called in a different antibiotic for her it is a 10-day course and is taken twice a day.

## 2023-09-21 ENCOUNTER — Encounter: Payer: Self-pay | Admitting: Pharmacist

## 2023-09-21 NOTE — Telephone Encounter (Signed)
 See telephone note

## 2023-09-21 NOTE — Progress Notes (Signed)
 09/21/2023 Name: Crystal Horn MRN: [id number] DOB: 1950-12-04  Chief Complaint  Patient presents with   Medication Management    JEN BENEDICT is a 73 y.o. year old female who presented for a telephone visit.   They were referred to the pharmacist by their PCP for assistance in managing medication access.    Subjective:  Received message that patient has not been able to start QVAR inhaler due to cost.  She has [facility] plan but unable to determine which one - no copy of card in her EPIC records.   Called patient to get more information.  She has [employer] H[employer] Medicare plan [phone]  Medication Access/Adherence  Current Pharmacy:  [id number] - [place], [place] - [address] [place] [address] Phone: [phone] Fax: [phone]   Patient reports affordability concerns with their medications: Yes  Patient reports access/transportation concerns to their pharmacy: No  Patient reports adherence concerns with their medications:  Yes  unable to get QVAR inhaler  She does have albuterol inhaler on hand and uses as needed fro shortness of breath / wheezing.   Objective:  Lab Results  Component Value Date   HGBA1C 6.0 03/01/2023    Lab Results  Component Value Date   CREATININE 0.62 03/01/2023   BUN 15 03/01/2023   NA 139 03/01/2023   K 3.8 03/01/2023   CL 104 03/01/2023   CO2 26 03/01/2023    Lab Results  Component Value Date   CHOL 194 03/01/2023   HDL 64.80 03/01/2023   LDLCALC 112 (H) 03/01/2023   LDLDIRECT 208.0 01/20/2021   TRIG 85.0 03/01/2023   CHOLHDL 3 03/01/2023    Medications Reviewed Today     Reviewed by Henrene Pastor, RPH-CPP (Pharmacist) on 09/21/23 at 0930  Med List Status: <None>   Medication Order Taking? Sig Documenting Provider Last Dose Status Informant  albuterol (VENTOLIN HFA) 108 (90 Base) MCG/ACT inhaler [id number] Yes Inhale 1-2 puffs into the lungs every  6 (six) hours as needed for wheezing or shortness of breath. Kuneff, Renee A, DO Taking Active   alendronate (FOSAMAX) 70 MG tablet [id number] Yes Take 1 tablet (70 mg total) by mouth every 7 (seven) days. Take with a full glass of water on an empty stomach. Kuneff, Renee A, DO Taking Active   atorvastatin (LIPITOR) 20 MG tablet [id number] Yes Take 1 tablet (20 mg total) by mouth every evening. Kuneff, Renee A, DO Taking Active   beclomethasone (QVAR REDIHALER) 40 MCG/ACT inhaler [id number] No Inhale 1 puff into the lungs 2 (two) times daily.  Patient not taking: Reported on 09/21/2023   Felix Pacini A, DO Not Taking Active   cefdinir (OMNICEF) 300 MG capsule [id number]  Take 1 capsule (300 mg total) by mouth 2 (two) times daily. Kuneff, Renee A, DO  Active   Cholecalciferol (VITAMIN D3) 50 MCG (2000 UT) TABS [id number]  Take by mouth. [provider]  Active   escitalopram (LEXAPRO) 5 MG tablet [id number]  Take 1 tablet (5 mg total) by mouth daily. Kuneff, Renee A, DO  Active   loratadine (CLARITIN) 10 MG tablet [id number]  Take 1 tablet (10 mg total) by mouth daily. Kuneff, Renee A, DO  Active   LORazepam (ATIVAN) 0.5 MG tablet [id number]  Take 1 tablet (0.5 mg total) by mouth daily as needed. Kuneff, Renee A, DO  Active   omeprazole (PRILOSEC) 20 MG capsule [id number]  Take 1 capsule (20 mg total) by  mouth daily. Kuneff, Renee A, DO  Active   ondansetron (ZOFRAN) 4 MG tablet [id number]  Take 1 tablet (4 mg total) by mouth every 8 (eight) hours as needed for nausea or vomiting. Kuneff, Renee A, DO  Active   promethazine-dextromethorphan (PROMETHAZINE-DM) 6.25-15 MG/5ML syrup [id number]  Take 5 mLs by mouth every 6 (six) hours as needed for cough. Kuneff, Renee A, DO  Active               Assessment/Plan:   Medication Management and access:  - Reviewed her 2025 Medicare plan. She has a $375 deductible to meet, then inhalers listed below would be $45 / month however first fill would e $420  ($375 deductible + $45 copay)   QVAR, Arnuity, Asmanex, Flovent / fluticasone HFA  Checked cost on GoodRx as well.   QVAR - 1 month is $230 at [facility] [phone] at [facility]  Budesonide nebules (would need nebulizer) #30 = $69  Arnuity - $219  Asmanex reg or HFA - $120  Fluticasone HFA (generic for Flovent) = $178    Generic Advair (ICS + LABA) - $97  - Patient might be able to contact [employer] to ask to be changed to monthly payment plan for deductible - amount would be spread over what is left of 2025 (March thru December). This amount could vary based on how BCBS sets up payment but would likely be $90 to $100 per month.  Henrene Pastor, PharmD Clinical Pharmacist [facility]  Population Health [phone]

## 2023-09-25 ENCOUNTER — Ambulatory Visit (HOSPITAL_BASED_OUTPATIENT_CLINIC_OR_DEPARTMENT_OTHER): Admission: RE | Admit: 2023-09-25 | Payer: Medicare Other | Source: Ambulatory Visit | Admitting: Radiology

## 2023-09-25 ENCOUNTER — Other Ambulatory Visit (HOSPITAL_BASED_OUTPATIENT_CLINIC_OR_DEPARTMENT_OTHER): Payer: Medicare Other

## 2023-09-26 ENCOUNTER — Ambulatory Visit: Payer: Medicare Other | Admitting: Family Medicine

## 2023-09-26 ENCOUNTER — Ambulatory Visit (HOSPITAL_BASED_OUTPATIENT_CLINIC_OR_DEPARTMENT_OTHER)
Admission: RE | Admit: 2023-09-26 | Discharge: 2023-09-26 | Disposition: A | Discharge status: Home / Self Care | Source: Ambulatory Visit | Attending: Family Medicine | Admitting: Family Medicine

## 2023-09-26 ENCOUNTER — Encounter: Payer: Self-pay | Admitting: Family Medicine

## 2023-09-26 VITALS — BP 113/70 | HR 78 | Temp 98.3°F | Ht 62.0 in | Wt 133.0 lb

## 2023-09-26 DIAGNOSIS — J180 Bronchopneumonia, unspecified organism: Secondary | ICD-10-CM | POA: Diagnosis not present

## 2023-09-26 DIAGNOSIS — R059 Cough, unspecified: Secondary | ICD-10-CM | POA: Diagnosis not present

## 2023-09-26 DIAGNOSIS — R911 Solitary pulmonary nodule: Secondary | ICD-10-CM | POA: Diagnosis not present

## 2023-09-26 MED ORDER — IPRATROPIUM-ALBUTEROL 0.5-2.5 (3) MG/3ML IN SOLN
3.0000 mL | Freq: Once | RESPIRATORY_TRACT | Status: AC
  Administered 2023-09-26: 3 mL via RESPIRATORY_TRACT
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
## 2023-09-26 NOTE — Addendum Note (Signed)
 Addended by: Emi Holes D on: 09/26/2023 11:16 AM   Modules accepted: Orders

## 2023-09-27 ENCOUNTER — Telehealth: Payer: Self-pay

## 2023-09-27 NOTE — Telephone Encounter (Signed)
**Note De-identified  [place] Obfuscation** Please advise 

## 2023-09-27 NOTE — Telephone Encounter (Signed)
 Copied from CRM [id number]. Topic: Clinical - Lab/Test Results >> Sep 27, 2023  2:18 PM Crystal Horn wrote: Reason for CRM: Patient states she went for a chest x ray order not put in as stat so it can take up to a week to read them and patient is asking if provider can call over to obtain results. Patient states she needs an answer today because she's supposed to go to work tomorrow.  [name] [phone]

## 2023-09-29 ENCOUNTER — Encounter: Payer: Self-pay | Admitting: Family Medicine

## 2023-09-29 ENCOUNTER — Ambulatory Visit: Payer: Self-pay | Admitting: Family Medicine

## 2023-09-29 ENCOUNTER — Telehealth: Payer: Self-pay

## 2023-09-29 ENCOUNTER — Telehealth: Payer: Self-pay | Admitting: Family Medicine

## 2023-09-29 NOTE — Telephone Encounter (Signed)
 Pt advised of results.

## 2023-09-29 NOTE — Telephone Encounter (Signed)
 LM for pt to return call to discuss.

## 2023-09-29 NOTE — Telephone Encounter (Signed)
 Copied from CRM [id number]. Topic: Clinical - Lab/Test Results >> Sep 27, 2023  2:18 PM Gurney Maxin H wrote: Reason for CRM: Patient states she went for a chest x ray order not put in as stat so it can take up to a week to read them and patient is asking if provider can call over to obtain results. Patient states she needs an answer today because she's supposed to go to work tomorrow.  Olivine [phone] >> Sep 29, 2023 11:32 AM Sim Boast F wrote: Patient called to follow up regarding the status of results from her chest x-ray, I did let her know that message is to the doctors attention and that we are waiting for a response. I am sending separate CRM to clinical pool  >> Sep 28, 2023  5:22 PM [place] J wrote: Patient following up on imaging status and would like it to be changed to STAT so she can return to work. >> Sep 28, 2023  8:41 AM Jon Gills C wrote: Patient called in today wanted to know about the status on if Dr.Kuneff has sent over the STAT for chest xray is requesting someone gives her a callback regarding this issue

## 2023-09-29 NOTE — Telephone Encounter (Signed)
 Please inform patient the x-ray did not need a stat criteria, therefore I cannot put in for stat. I looked over her x-ray and it appears that her lungs are well aerated and I believe her continued symptoms are not from an infectious process. I recommend she make sure she is starting the Qvar and if she is having continued symptoms, I would encourage her to be reevaluated by her pulmonologist as the next step.  Of course, once radiology formally reads for x-ray we will call her with those results.

## 2023-09-29 NOTE — Telephone Encounter (Signed)
  Chief Complaint: request chest x-ray results to be expedited and continued cough Symptoms: continuous cough that is nonproductive-out of cough medication Frequency: couple of weeks Pertinent Negatives: Patient denies fever Disposition: [] ED /[] Urgent Care (no appt availability in office) / [] Appointment(In office/virtual)/ []  [facility]/ [] Home Care/ [] Refused Recommended Disposition /[] [facility]/ [x]  Follow-up with PCP Additional Notes: patient called very frustrated about needing her chest x-ray results-patient was waiting to see results to start antibiotics that had been called in. Patient states continued cough-out of cough medication. Patient is asking for staff to call her back. Patient is needing clarification on medication she was given earlier this week. Patient verbalized understanding of plan and all questions answered.    Copied from CRM [phone]. Topic: Referral - Question >> Sep 29, 2023  1:49 PM Isabelle Course C wrote: Reason for CRM: Patient called back and  stated she went for a chest x ray... the  order was not put in as stat. It can take up to a week to read them and patient is asking if provider can call over to obtain results. Patient stated that she is very frustrated. Dorthia [phone] Reason for Disposition  SEVERE coughing spells (e.g., whooping sound after coughing, vomiting after coughing)  Answer Assessment - Initial Assessment Questions 1. ONSET: "When did the cough begin?"     Started three weeks ago 2. SEVERITY: "How bad is the cough today?"      7 out of 10 3. SPUTUM: "Describe the color of your sputum" (none, dry cough; clear, white, yellow, green)     non 4. HEMOPTYSIS: "Are you coughing up any blood?" If so ask: "How much?" (flecks, streaks, tablespoons, etc.)     No 5. DIFFICULTY BREATHING: "Are you having difficulty breathing?" If Yes, ask: "How bad is it?" (e.g., mild, moderate, severe)    - MILD: No SOB at rest, mild SOB with walking,  speaks normally in sentences, can lie down, no retractions, pulse < 100.    - MODERATE: SOB at rest, SOB with minimal exertion and prefers to sit, cannot lie down flat, speaks in phrases, mild retractions, audible wheezing, pulse 100-120.    - SEVERE: Very SOB at rest, speaks in single words, struggling to breathe, sitting hunched forward, retractions, pulse > 120      Mild 6. FEVER: "Do you have a fever?" If Yes, ask: "What is your temperature, how was it measured, and when did it start?"     No 7. CARDIAC HISTORY: "Do you have any history of heart disease?" (e.g., heart attack, congestive heart failure)      No 8. LUNG HISTORY: "Do you have any history of lung disease?"  (e.g., pulmonary embolus, asthma, emphysema)     No 9. PE RISK FACTORS: "Do you have a history of blood clots?" (or: recent major surgery, recent prolonged travel, bedridden)     No 10. OTHER SYMPTOMS: "Do you have any other symptoms?" (e.g., runny nose, wheezing, chest pain)       No  Protocols used: Cough - Acute Non-Productive-A-AH

## 2023-09-29 NOTE — Telephone Encounter (Signed)
 Reason for CRM: Patient had chest x-ray 3/4, called 3/5 for results and has been calling to follow up every since. Can someone please call her to let her know the status of the results so she has a time frame on when she should be expecting a phone call with results.      see other phone note.

## 2023-09-29 NOTE — Telephone Encounter (Signed)
 Please call [facility] reading room to have XR read [phone]

## 2023-09-29 NOTE — Telephone Encounter (Signed)
 Radiology notified. LVM for pt.

## 2023-10-04 ENCOUNTER — Ambulatory Visit: Admitting: Nurse Practitioner

## 2023-10-04 ENCOUNTER — Encounter: Payer: Self-pay | Admitting: Emergency Medicine

## 2023-10-04 ENCOUNTER — Encounter: Payer: Self-pay | Admitting: Nurse Practitioner

## 2023-10-04 VITALS — BP 130/82 | HR 79 | Temp 98.3°F | Ht 62.0 in | Wt 132.2 lb

## 2023-10-04 DIAGNOSIS — R058 Other specified cough: Secondary | ICD-10-CM | POA: Insufficient documentation

## 2023-10-04 DIAGNOSIS — R911 Solitary pulmonary nodule: Secondary | ICD-10-CM | POA: Diagnosis not present

## 2023-10-04 DIAGNOSIS — U071 COVID-19: Secondary | ICD-10-CM

## 2023-10-04 DIAGNOSIS — R0609 Other forms of dyspnea: Secondary | ICD-10-CM | POA: Diagnosis not present

## 2023-10-04 DIAGNOSIS — R059 Cough, unspecified: Secondary | ICD-10-CM | POA: Diagnosis not present

## 2023-10-04 HISTORY — DX: Other forms of dyspnea: R06.09

## 2023-10-04 LAB — D-DIMER, QUANTITATIVE: D-Dimer, Quant: 0.36 ug{FEU}/mL (ref <0.50)

## 2023-10-04 LAB — BASIC METABOLIC PANEL
BUN: 21 mg/dL (ref 6–23)
CO2: 27 meq/L (ref 19–32)
Calcium: 9.8 mg/dL (ref 8.4–10.5)
Chloride: 103 meq/L (ref 96–112)
Creatinine, Ser: 0.65 mg/dL (ref 0.40–1.20)
GFR: 88.97 mL/min (ref >60.00)
Glucose, Bld: 101 mg/dL — ABNORMAL HIGH (ref 70–99)
Potassium: 4.1 meq/L (ref 3.5–5.1)
Sodium: 140 meq/L (ref 135–145)

## 2023-10-04 LAB — POCT EXHALED NITRIC OXIDE: FeNO level (ppb): 12

## 2023-10-04 MED ORDER — HYDROCODONE BIT-HOMATROP MBR 5-1.5 MG/5ML PO SOLN: 5.0000 mL | Freq: Four times a day (QID) | ORAL | 0 refills | Status: DC | PRN

## 2023-10-04 MED ORDER — BENZONATATE 200 MG PO CAPS: 200.0000 mg | ORAL_CAPSULE | Freq: Three times a day (TID) | ORAL | 1 refills | Status: DC | PRN

## 2023-10-04 MED ORDER — FLUTICASONE PROPIONATE 50 MCG/ACT NA SUSP: 2.0000 | Freq: Every day | NASAL | 2 refills | Status: AC

## 2023-10-04 MED ORDER — PREDNISONE 10 MG PO TABS: ORAL_TABLET | ORAL | 0 refills | Status: DC

## 2023-10-04 NOTE — Assessment & Plan Note (Signed)
 Post viral cough/bronchitic illness. Cough paroxysmal at times. Component likely due to upper airway irritation/inflammation from postnasal drainage. Improvement with prior steroid injection and azithromycin, likely due to antiinflammatory properties. Recent CXR without evidence of superimposed infection. FeNO nl today; on Qvar. Will challenge her with additional prednisone taper. Cough control measures and target postnasal drainage. Close follow up.   Patient Instructions  Continue Albuterol inhaler 2 puffs every 6 hours as needed for shortness of breath or wheezing. Notify if symptoms persist despite rescue inhaler/neb use.  Continue Qvar 1 puff Twice daily. Brush tongue and rinse mouth afterwards.  Continue claritin 1 tab daily Continue omeprazole 1 tab daily  -Prednisone taper. 4 tabs for 2 days, then 3 tabs for 2 days, 2 tabs for 2 days, then 1 tab for 2 days, then stop. Take in AM with food.  -Saline nasal rinse 1-2 times a day as needed for nasal congestion/postnasal drip until symptoms improve. Follow with flonase 2 sprays each nostril daily in AM 30 minutes after the rinse  -Mucinex 600 mg twice daily as needed for cough/congestion until symptoms improve -Chlortab 4mg  over the counter at night as needed for cough/drainage -Benzonatate 1 capsule Three times a day for cough. Use consistently over the next few days -Hycodan cough syrup 5 mL every 6 hours as needed for cough. Do not drive after taking. May cause drowsiness  Labs today - I will call you with results   Follow up in 2 weeks with Dr. Delton Coombes or Philis Nettle. If symptoms do not improve or worsen, please contact office for sooner follow up or seek emergency care.

## 2023-10-04 NOTE — Patient Instructions (Addendum)
 Continue Albuterol inhaler 2 puffs every 6 hours as needed for shortness of breath or wheezing. Notify if symptoms persist despite rescue inhaler/neb use.  Continue Qvar 1 puff Twice daily. Brush tongue and rinse mouth afterwards.  Continue claritin 1 tab daily Continue omeprazole 1 tab daily  -Prednisone taper. 4 tabs for 2 days, then 3 tabs for 2 days, 2 tabs for 2 days, then 1 tab for 2 days, then stop. Take in AM with food.  -Saline nasal rinse 1-2 times a day as needed for nasal congestion/postnasal drip until symptoms improve. Follow with flonase 2 sprays each nostril daily in AM 30 minutes after the rinse  -Mucinex 600 mg twice daily as needed for cough/congestion until symptoms improve -Chlortab 4mg  over the counter at night as needed for cough/drainage -Benzonatate 1 capsule Three times a day for cough. Use consistently over the next few days -Hycodan cough syrup 5 mL every 6 hours as needed for cough. Do not drive after taking. May cause drowsiness  Labs today - I will call you with results   Follow up in 2 weeks with Dr. Delton Coombes or Philis Nettle. If symptoms do not improve or worsen, please contact office for sooner follow up or seek emergency care.
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
## 2023-10-04 NOTE — Assessment & Plan Note (Signed)
 Given DOE, lightheadedness/dizziness and persistent cough with recent viral illness, will rule out PE with d dimer today. If positive, will need CTA chest PE protocol. See above.

## 2023-10-05 ENCOUNTER — Encounter: Payer: Self-pay | Admitting: Nurse Practitioner

## 2023-10-05 NOTE — Assessment & Plan Note (Signed)
 Benign. No dedicate follow up necessary

## 2023-10-10 ENCOUNTER — Encounter (HOSPITAL_BASED_OUTPATIENT_CLINIC_OR_DEPARTMENT_OTHER): Payer: Self-pay | Admitting: Radiology

## 2023-10-10 ENCOUNTER — Ambulatory Visit (HOSPITAL_BASED_OUTPATIENT_CLINIC_OR_DEPARTMENT_OTHER)
Admission: RE | Admit: 2023-10-10 | Discharge: 2023-10-10 | Disposition: A | Discharge status: Home / Self Care | Source: Ambulatory Visit | Attending: Family Medicine | Admitting: Family Medicine

## 2023-10-10 DIAGNOSIS — Z1231 Encounter for screening mammogram for malignant neoplasm of breast: Secondary | ICD-10-CM | POA: Diagnosis not present

## 2023-10-13 ENCOUNTER — Other Ambulatory Visit: Payer: Self-pay | Admitting: Family Medicine

## 2023-10-13 DIAGNOSIS — R928 Other abnormal and inconclusive findings on diagnostic imaging of breast: Secondary | ICD-10-CM

## 2023-10-18 ENCOUNTER — Encounter: Payer: Self-pay | Admitting: Nurse Practitioner

## 2023-10-18 ENCOUNTER — Ambulatory Visit (INDEPENDENT_AMBULATORY_CARE_PROVIDER_SITE_OTHER): Admitting: Nurse Practitioner

## 2023-10-18 VITALS — BP 120/80 | HR 78 | Ht 62.0 in | Wt 134.6 lb

## 2023-10-18 DIAGNOSIS — J302 Other seasonal allergic rhinitis: Secondary | ICD-10-CM | POA: Diagnosis not present

## 2023-10-18 DIAGNOSIS — R058 Other specified cough: Secondary | ICD-10-CM | POA: Diagnosis not present

## 2023-10-18 MED ORDER — LEVOCETIRIZINE DIHYDROCHLORIDE 5 MG PO TABS: 5.0000 mg | ORAL_TABLET | Freq: Every evening | ORAL | 5 refills | Status: DC

## 2023-10-18 MED ORDER — HYDROCODONE BIT-HOMATROP MBR 5-1.5 MG/5ML PO SOLN: 5.0000 mL | Freq: Four times a day (QID) | ORAL | 0 refills | Status: DC | PRN

## 2023-10-18 MED ORDER — AZELASTINE HCL 0.1 % NA SOLN: 2.0000 | Freq: Two times a day (BID) | NASAL | 5 refills | Status: DC

## 2023-10-18 MED ORDER — BENZONATATE 200 MG PO CAPS: 200.0000 mg | ORAL_CAPSULE | Freq: Three times a day (TID) | ORAL | 1 refills | Status: DC | PRN

## 2023-10-18 MED ORDER — PREDNISONE 10 MG PO TABS: ORAL_TABLET | ORAL | 0 refills | Status: DC

## 2023-10-18 NOTE — Patient Instructions (Addendum)
 Continue Albuterol inhaler 2 puffs every 6 hours as needed for shortness of breath or wheezing. Notify if symptoms persist despite rescue inhaler/neb use.  Continue Qvar 1 puff Twice daily. Brush tongue and rinse mouth afterwards.  Continue omeprazole 1 tab daily -Continue Chlortab 4mg  over the counter at night as needed for cough/drainage -Continue Benzonatate 1 capsule Three times a day for cough. Use consistently over the next few days -Continue Hycodan cough syrup 5 mL every 6 hours as needed for cough. Do not drive after taking. May cause drowsiness   -Stop claritin 1 tab daily. Start levocetirizine daily for allergies  -Prednisone taper. 20 mg for 5 days then 10 mg for 5 days then stop  -Saline nasal rinse 1-2 times a day as needed for nasal congestion/postnasal drip until symptoms improve. Follow with the flonase 1-2 sprays each nostril once daily and then astelin nasal spray 1-2 sprays each nostril Twice daily 30 minutes after the rinse   Upper airway cough syndrome: Suppress your cough to allow your larynx (voice box) to heal.  Limit talking for the next few days. Avoid throat clearing. Work on cough suppression with the above recommended suppressants.  Use sugar free hard candies or non-menthol cough drops during this time to soothe your throat.  Warm tea with honey and lemon.     Follow up in 4-6 weeks with Dr. Delton Coombes or Philis Nettle. If symptoms do not improve or worsen, please contact office for sooner follow up or seek emergency care.
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
## 2023-10-19 ENCOUNTER — Other Ambulatory Visit: Payer: Self-pay

## 2023-10-19 DIAGNOSIS — N2889 Other specified disorders of kidney and ureter: Secondary | ICD-10-CM

## 2023-10-20 ENCOUNTER — Encounter: Payer: Self-pay | Admitting: Nurse Practitioner

## 2023-10-27 ENCOUNTER — Ambulatory Visit: Payer: Medicare Other | Admitting: Urology

## 2023-10-27 ENCOUNTER — Ambulatory Visit
Admission: RE | Admit: 2023-10-27 | Discharge: 2023-10-27 | Disposition: A | Discharge status: Home / Self Care | Source: Ambulatory Visit | Attending: Family Medicine | Admitting: Family Medicine

## 2023-10-27 DIAGNOSIS — N6323 Unspecified lump in the left breast, lower outer quadrant: Secondary | ICD-10-CM | POA: Diagnosis not present

## 2023-10-27 DIAGNOSIS — R928 Other abnormal and inconclusive findings on diagnostic imaging of breast: Secondary | ICD-10-CM

## 2023-10-30 ENCOUNTER — Other Ambulatory Visit: Payer: Self-pay | Admitting: Family Medicine

## 2023-10-30 DIAGNOSIS — N6002 Solitary cyst of left breast: Secondary | ICD-10-CM

## 2023-10-30 DIAGNOSIS — N6320 Unspecified lump in the left breast, unspecified quadrant: Secondary | ICD-10-CM

## 2023-10-31 ENCOUNTER — Ambulatory Visit
Admission: RE | Admit: 2023-10-31 | Discharge: 2023-10-31 | Discharge status: Home / Self Care | Source: Ambulatory Visit | Attending: Family Medicine | Admitting: Family Medicine

## 2023-10-31 ENCOUNTER — Ambulatory Visit
Admission: RE | Admit: 2023-10-31 | Discharge: 2023-10-31 | Disposition: A | Discharge status: Home / Self Care | Source: Ambulatory Visit | Attending: Family Medicine | Admitting: Family Medicine

## 2023-10-31 DIAGNOSIS — N6320 Unspecified lump in the left breast, unspecified quadrant: Secondary | ICD-10-CM

## 2023-10-31 DIAGNOSIS — N6002 Solitary cyst of left breast: Secondary | ICD-10-CM

## 2023-10-31 DIAGNOSIS — N6012 Diffuse cystic mastopathy of left breast: Secondary | ICD-10-CM | POA: Diagnosis not present

## 2023-11-01 ENCOUNTER — Other Ambulatory Visit

## 2023-11-03 ENCOUNTER — Ambulatory Visit (HOSPITAL_BASED_OUTPATIENT_CLINIC_OR_DEPARTMENT_OTHER)

## 2023-11-08 ENCOUNTER — Ambulatory Visit: Admitting: Emergency Medicine

## 2023-11-28 ENCOUNTER — Telehealth: Payer: Self-pay | Admitting: *Deleted

## 2023-11-28 NOTE — Telephone Encounter (Signed)
 I called and spoke with the pt  She was asking about what test we did at her ov 10/04/23  It was a feno (inhaled nitric oxide  test)  I advised her of this She verbalized understanding and states nothing further needed

## 2023-11-28 NOTE — Telephone Encounter (Signed)
 Copied from CRM [id number]. Topic: Clinical - Medical Advice >> Nov 28, 2023  3:12 PM Tyronne Galloway wrote: Reason for CRM: Pt stated her last visit she had a test done that was very informative, but she forgot what it was called. I attempted to view the visit notes, but I didn't find the name of it. Please call the patient back at [phone] ok to leave a vm.  I do not see any "test" done at her last visit  I have called the pt and there was no answer- LMTCB

## 2023-12-01 DIAGNOSIS — K080 Exfoliation of teeth due to systemic causes: Secondary | ICD-10-CM | POA: Diagnosis not present

## 2023-12-06 ENCOUNTER — Other Ambulatory Visit (HOSPITAL_BASED_OUTPATIENT_CLINIC_OR_DEPARTMENT_OTHER)

## 2023-12-08 ENCOUNTER — Ambulatory Visit: Payer: Self-pay

## 2023-12-08 NOTE — Telephone Encounter (Signed)
 Chief Complaint: Intermittent shortness of breath started again one week ago  Symptoms: Shortness of breath with movement, dry cough "repetitive like it used to be" Frequency: Intermittent Pertinent Negatives: Patient denies wheezing  Disposition:  [x] Appointment (In office)  Additional Notes: Patient called to see if she can get an earlier appointment with Girard Lam, NP. Pt has an appt scheduled for 5/21. No earlier appts available but pt added to waitlist. Pt is requesting Cobb, NP, contacts her insurance customer service number at [phone] for a tier change exception to change her tier to a level 2 to lower her copay cost on the QVAR  inhaler. Pt also would like to know if the tier cannot be changed, is there a different inhaler than albuterol  that she can be prescribed. Pt would like an update on tier change. Call back number is [phone]. This RN educated pt on new-worsening symptoms and when to call back/seek emergent care. Pt verbalized understanding and agrees to plan.     Reason for Disposition  [1] MILD longstanding difficulty breathing AND [2]  SAME as normal  Answer Assessment - Initial Assessment Questions Chief Complaint: Intermittent shortness of breath started again one week ago  Symptoms: Shortness of breath with movement, dry cough "repetitive like it used to be"  Frequency: Intermittent  Pertinent Negatives: Patient denies wheezing  Protocols used: Breathing Difficulty-A-AH

## 2023-12-08 NOTE — Telephone Encounter (Signed)
 Copied from CRM [id number]. Topic: Clinical - Red Word Triage >> Dec 08, 2023 11:09 AM Evie Hoff wrote: [facility] that prompted transfer to Nurse Triage: having a hard time breathing shortness of breath coughing getting worse everything was going good to the last week . Taking everything she prescribed but it has gotten worse .  Girard Lam NP [facility] pulmonary   [phone] Pulmonary Triage - Initial Assessment Questions "Chief Complaint (e.g., cough, sob, wheezing, fever, chills, sweat or additional symptoms) *Go to specific symptom protocol after initial questions. Dyspnea, Worsening   TRIAGE SUMMARY: This Triage RN attempted to call the patient back at this time, no answer, left a voicemail. The patient disconnected briefly before warm transfer.

## 2023-12-12 IMAGING — MG MM DIGITAL SCREENING BILAT W/ TOMO AND CAD
8 series · 8 of 24 positions shown · non-contrast
Comparison: Previous exam(s).

CLINICAL DATA: Screening.

EXAM:
DIGITAL SCREENING BILATERAL MAMMOGRAM WITH TOMOSYNTHESIS AND CAD
TECHNIQUE: Bilateral screening digital craniocaudal and mediolateral oblique
mammograms were obtained. Bilateral screening digital breast
tomosynthesis was performed. The images were evaluated with
computer-aided detection.

[R MLO synth-2D]
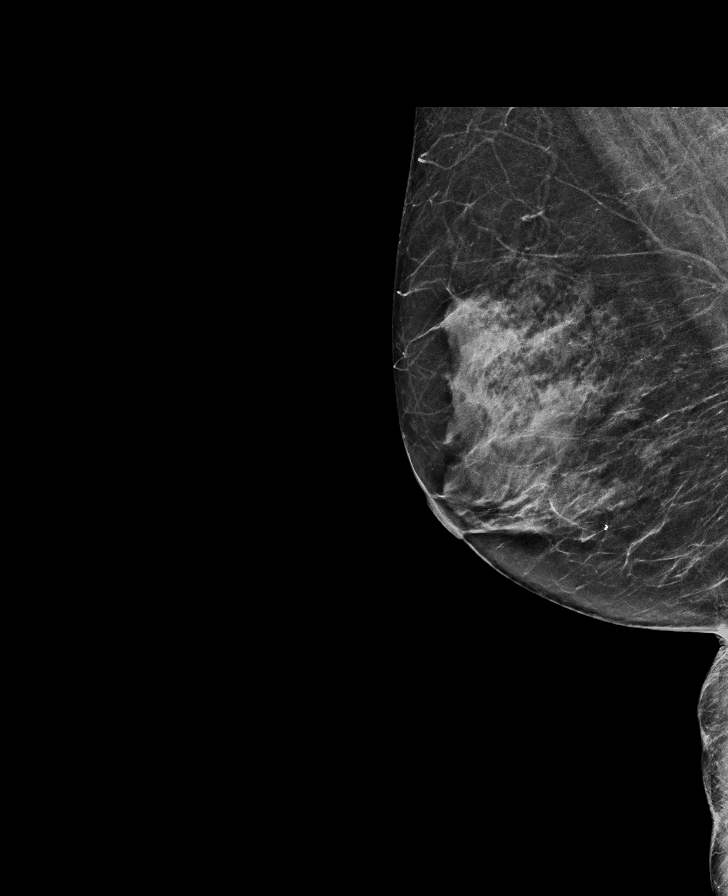

[L CC synth-2D]
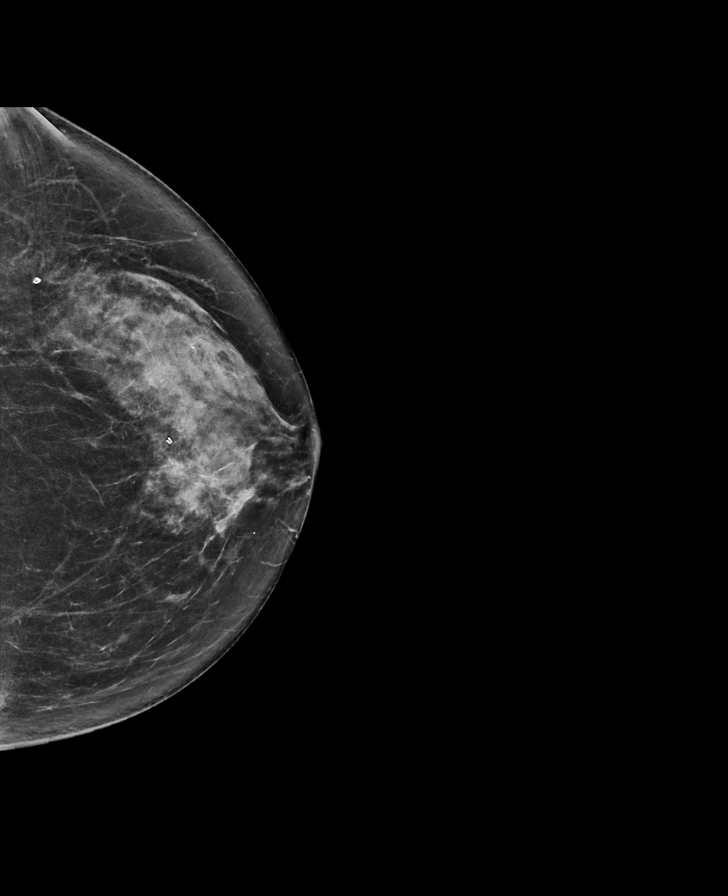

[R CC synth-2D]
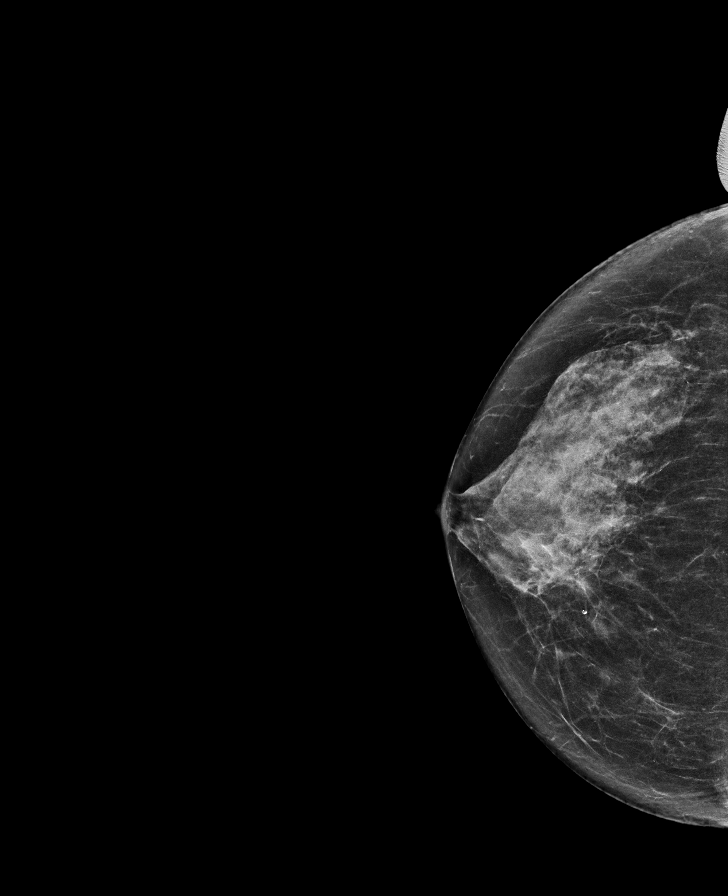

[L MLO synth-2D]
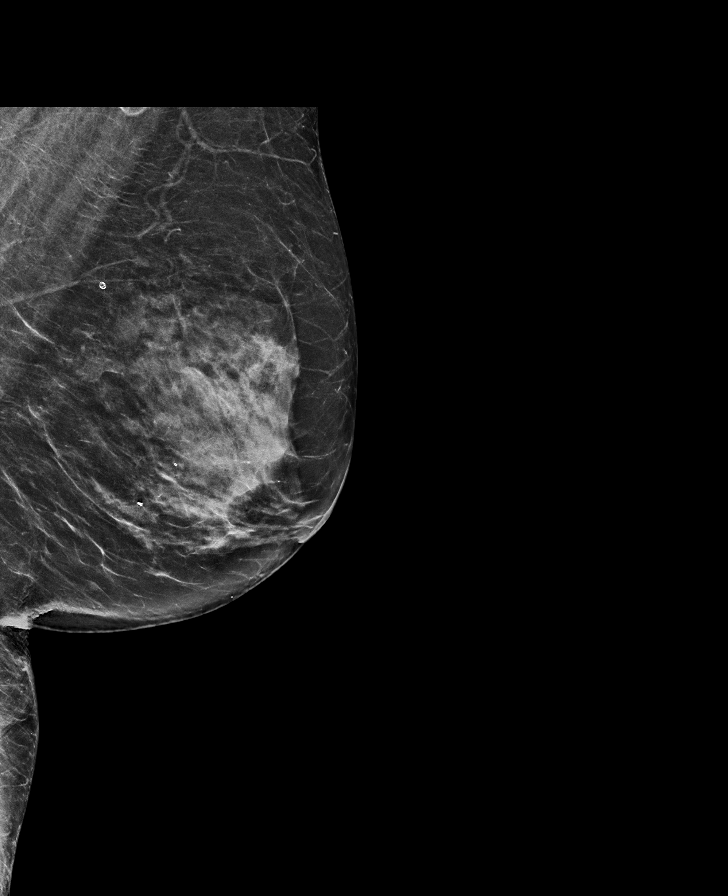

[L CC tomo · tomo slice 36/71.0]
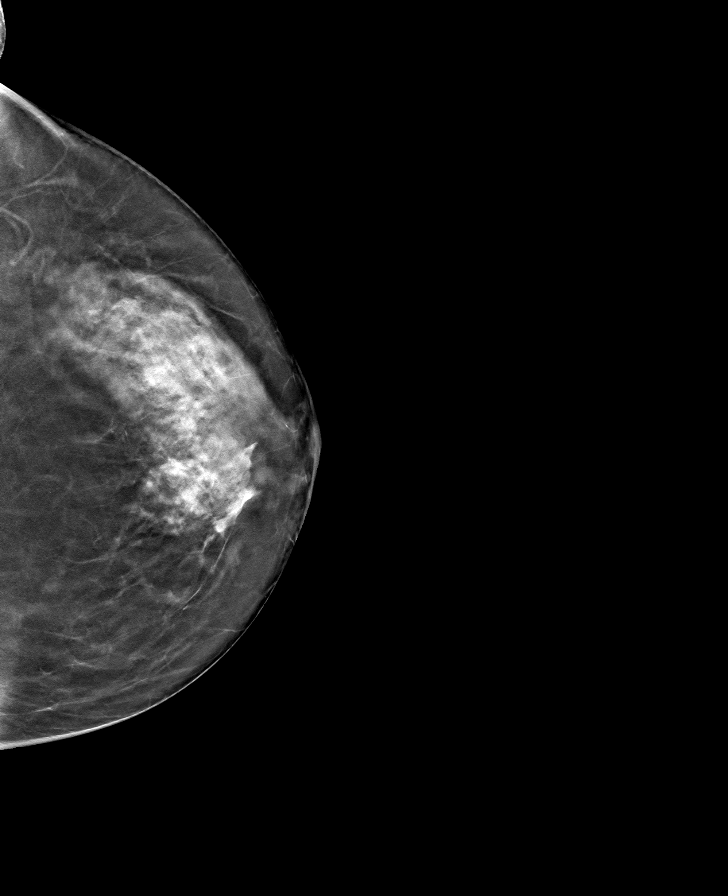

[L MLO tomo · tomo slice 35/70.0]
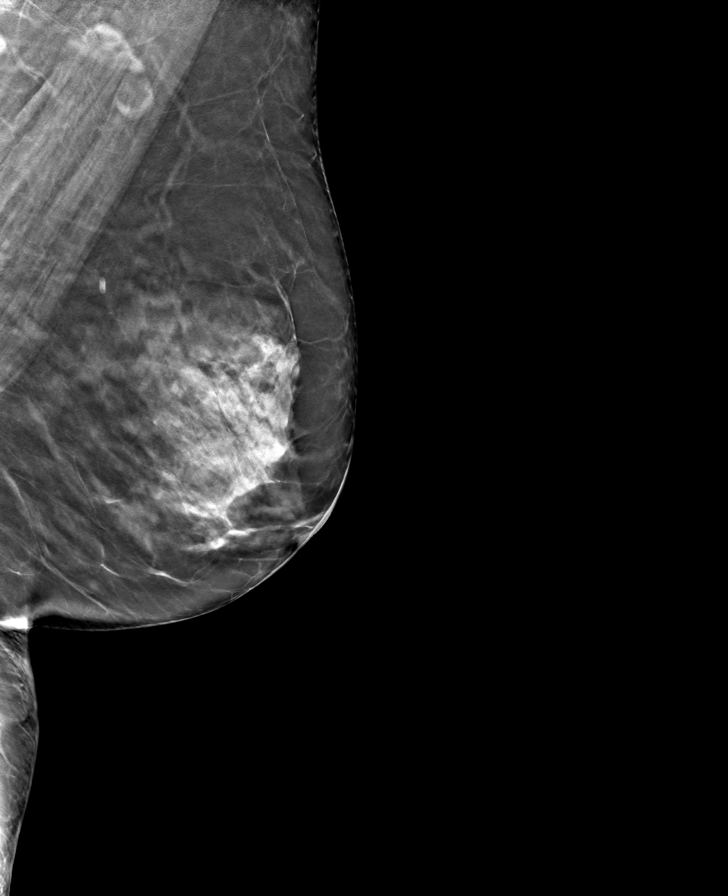

[R CC tomo · tomo slice 32/63.0]
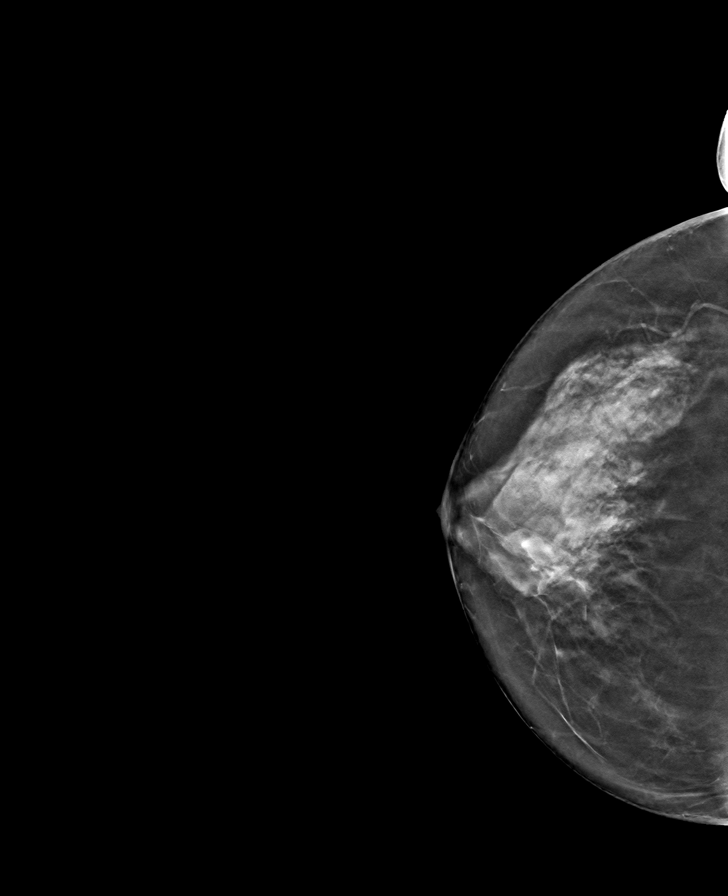

[R MLO tomo · tomo slice 33/64.0]
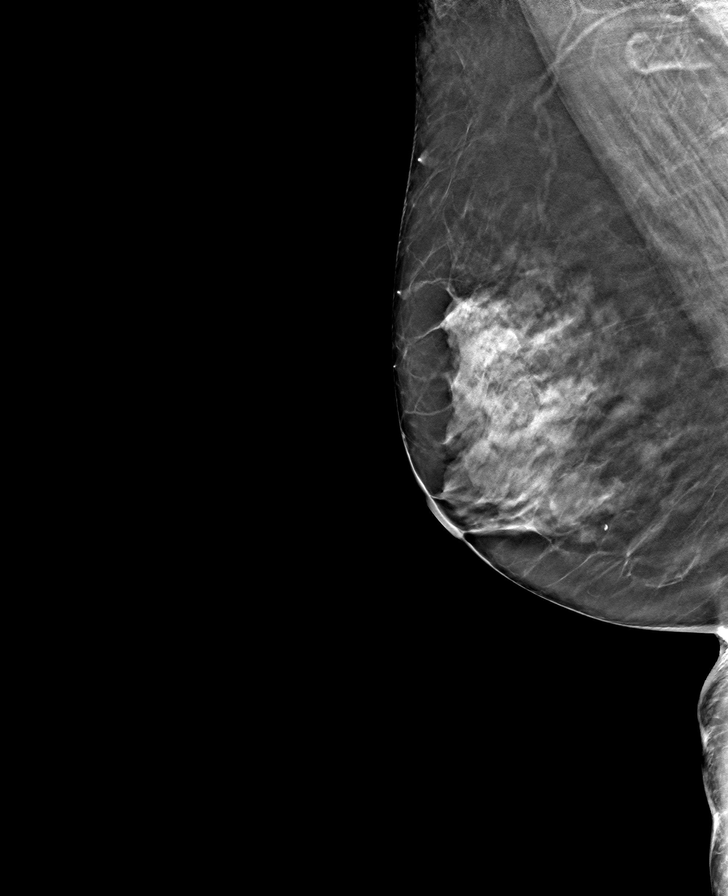

[8 of 24 positions shown; findings below may reference images not displayed]

ACR Breast Density Category c: The breast tissue is heterogeneously
dense, which may obscure small masses.
FINDINGS: There are no findings suspicious for malignancy.
IMPRESSION: No mammographic evidence of malignancy. A result letter of this
screening mammogram will be mailed directly to the patient.

RECOMMENDATION:
Screening mammogram in one year. (Code:Q3-W-BC3)

BI-RADS CATEGORY  1: Negative.

## 2023-12-13 ENCOUNTER — Ambulatory Visit: Admitting: Nurse Practitioner

## 2023-12-14 ENCOUNTER — Encounter: Payer: Self-pay | Admitting: Nurse Practitioner

## 2023-12-14 ENCOUNTER — Ambulatory Visit: Admitting: Nurse Practitioner

## 2023-12-14 VITALS — BP 120/82 | Ht 60.0 in | Wt 135.2 lb

## 2023-12-14 DIAGNOSIS — J45991 Cough variant asthma: Secondary | ICD-10-CM | POA: Insufficient documentation

## 2023-12-14 DIAGNOSIS — Z87891 Personal history of nicotine dependence: Secondary | ICD-10-CM | POA: Diagnosis not present

## 2023-12-14 DIAGNOSIS — J302 Other seasonal allergic rhinitis: Secondary | ICD-10-CM

## 2023-12-14 DIAGNOSIS — R058 Other specified cough: Secondary | ICD-10-CM

## 2023-12-14 LAB — POCT EXHALED NITRIC OXIDE: FeNO level (ppb): 12

## 2023-12-14 MED ORDER — PREDNISONE 20 MG PO TABS: 20.0000 mg | ORAL_TABLET | Freq: Every day | ORAL | 0 refills | Status: AC

## 2023-12-14 MED ORDER — QVAR REDIHALER 40 MCG/ACT IN AERB: 1.0000 | INHALATION_SPRAY | Freq: Two times a day (BID) | RESPIRATORY_TRACT | 5 refills | Status: DC
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
## 2023-12-14 NOTE — Assessment & Plan Note (Signed)
 Improved sinus symptoms. See above. Continue current regimen.

## 2023-12-14 NOTE — Assessment & Plan Note (Signed)
 Possible cough variant asthma vs upper airway cough syndrome. She has improvement with use of ICS but since discontinuing, recurrent cough. Breathing is stable and no infectious symptoms. Lung exam and exhaled nitric oxide  testing normal. Insurance is not covering Qvar  with prior dx codes. Will resend today and advised her to call us  if there are still coverage issues so we can run a test claim to find alternative. She requested prednisone  burst to help reduce cough - rx sent today. Advised on risks of recurrent steroid use. Need to resume ICS and continue postnasal drainage management. Action plan in place. Prior PFT and CXR nl.  Patient Instructions  Continue Albuterol  inhaler 2 puffs every 6 hours as needed for shortness of breath or wheezing. Notify if symptoms persist despite rescue inhaler/neb use.  Continue omeprazole  1 tab daily -Continue levocetirizine daily for allergies  -Continue flonase  1-2 sprays each nostril once daily and then astelin  nasal spray 1-2 sprays each nostril Twice daily    Upper airway cough syndrome: Suppress your cough to allow your larynx (voice box) to heal.  Limit talking for the next few days. Avoid throat clearing. Work on cough suppression with the above recommended suppressants.  Use sugar free hard candies or non-menthol cough drops during this time to soothe your throat.  Warm tea with honey and lemon.    Restart Qvar  1 puff Twice daily. Brush tongue and rinse mouth afterwards.  If this isn't covered, we will find an alternative    Follow up in 3 months with Dr. Baldwin Levee or Gina Lagos. If symptoms do not improve or worsen, please contact office for sooner follow up or seek emergency care.

## 2023-12-14 NOTE — Patient Instructions (Addendum)
 Continue Albuterol  inhaler 2 puffs every 6 hours as needed for shortness of breath or wheezing. Notify if symptoms persist despite rescue inhaler/neb use.  Continue omeprazole  1 tab daily -Continue levocetirizine daily for allergies  -Continue flonase  1-2 sprays each nostril once daily and then astelin  nasal spray 1-2 sprays each nostril Twice daily    Upper airway cough syndrome: Suppress your cough to allow your larynx (voice box) to heal.  Limit talking for the next few days. Avoid throat clearing. Work on cough suppression with the above recommended suppressants.  Use sugar free hard candies or non-menthol cough drops during this time to soothe your throat.  Warm tea with honey and lemon.    Restart Qvar  1 puff Twice daily. Brush tongue and rinse mouth afterwards.  If this isn't covered, we will find an alternative    Follow up in 3 months with Dr. Baldwin Levee or Gina Lagos. If symptoms do not improve or worsen, please contact office for sooner follow up or seek emergency care.

## 2023-12-20 ENCOUNTER — Other Ambulatory Visit (HOSPITAL_BASED_OUTPATIENT_CLINIC_OR_DEPARTMENT_OTHER)

## 2024-01-10 ENCOUNTER — Ambulatory Visit (HOSPITAL_BASED_OUTPATIENT_CLINIC_OR_DEPARTMENT_OTHER)

## 2024-01-16 ENCOUNTER — Ambulatory Visit (HOSPITAL_BASED_OUTPATIENT_CLINIC_OR_DEPARTMENT_OTHER)

## 2024-01-23 ENCOUNTER — Other Ambulatory Visit: Payer: Self-pay | Admitting: Family Medicine

## 2024-01-23 DIAGNOSIS — Z87891 Personal history of nicotine dependence: Secondary | ICD-10-CM

## 2024-01-23 DIAGNOSIS — E782 Mixed hyperlipidemia: Secondary | ICD-10-CM

## 2024-01-23 DIAGNOSIS — Z8249 Family history of ischemic heart disease and other diseases of the circulatory system: Secondary | ICD-10-CM

## 2024-01-24 ENCOUNTER — Ambulatory Visit: Admitting: Urology

## 2024-01-27 ENCOUNTER — Ambulatory Visit (HOSPITAL_BASED_OUTPATIENT_CLINIC_OR_DEPARTMENT_OTHER)

## 2024-02-05 ENCOUNTER — Ambulatory Visit (HOSPITAL_BASED_OUTPATIENT_CLINIC_OR_DEPARTMENT_OTHER)
Admission: RE | Admit: 2024-02-05 | Discharge: 2024-02-05 | Disposition: A | Discharge status: Home / Self Care | Source: Ambulatory Visit | Attending: Urology | Admitting: Urology

## 2024-02-05 DIAGNOSIS — N2889 Other specified disorders of kidney and ureter: Secondary | ICD-10-CM | POA: Insufficient documentation

## 2024-02-05 DIAGNOSIS — N281 Cyst of kidney, acquired: Secondary | ICD-10-CM | POA: Diagnosis not present

## 2024-02-09 ENCOUNTER — Encounter: Payer: Self-pay | Admitting: Urology

## 2024-02-09 ENCOUNTER — Ambulatory Visit: Admitting: Urology

## 2024-02-09 VITALS — BP 121/75 | HR 73

## 2024-02-09 DIAGNOSIS — R109 Unspecified abdominal pain: Secondary | ICD-10-CM

## 2024-02-09 DIAGNOSIS — N281 Cyst of kidney, acquired: Secondary | ICD-10-CM | POA: Diagnosis not present

## 2024-02-09 DIAGNOSIS — Z800 Family history of malignant neoplasm of digestive organs: Secondary | ICD-10-CM | POA: Insufficient documentation

## 2024-02-09 DIAGNOSIS — K5900 Constipation, unspecified: Secondary | ICD-10-CM | POA: Insufficient documentation

## 2024-02-09 LAB — URINALYSIS, ROUTINE W REFLEX MICROSCOPIC
Bilirubin, UA: NEGATIVE
Glucose, UA: NEGATIVE
Ketones, UA: NEGATIVE
Leukocytes,UA: NEGATIVE
Nitrite, UA: NEGATIVE
Protein,UA: NEGATIVE
RBC, UA: NEGATIVE
Specific Gravity, UA: 1.015 (ref 1.005–1.030)
Urobilinogen, Ur: 0.2 mg/dL (ref 0.2–1.0)
pH, UA: 6.5 (ref 5.0–7.5)
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
## 2024-02-27 ENCOUNTER — Encounter: Payer: Medicare Other | Admitting: Family Medicine

## 2024-03-11 ENCOUNTER — Ambulatory Visit (HOSPITAL_BASED_OUTPATIENT_CLINIC_OR_DEPARTMENT_OTHER)

## 2024-03-12 ENCOUNTER — Other Ambulatory Visit: Payer: Self-pay | Admitting: Family Medicine

## 2024-03-18 DIAGNOSIS — H43813 Vitreous degeneration, bilateral: Secondary | ICD-10-CM | POA: Diagnosis not present

## 2024-03-18 DIAGNOSIS — L814 Other melanin hyperpigmentation: Secondary | ICD-10-CM | POA: Diagnosis not present

## 2024-03-18 DIAGNOSIS — L821 Other seborrheic keratosis: Secondary | ICD-10-CM | POA: Diagnosis not present

## 2024-03-18 DIAGNOSIS — H02834 Dermatochalasis of left upper eyelid: Secondary | ICD-10-CM | POA: Diagnosis not present

## 2024-03-18 DIAGNOSIS — D225 Melanocytic nevi of trunk: Secondary | ICD-10-CM | POA: Diagnosis not present

## 2024-03-18 DIAGNOSIS — H40023 Open angle with borderline findings, high risk, bilateral: Secondary | ICD-10-CM | POA: Diagnosis not present

## 2024-03-18 DIAGNOSIS — H02831 Dermatochalasis of right upper eyelid: Secondary | ICD-10-CM | POA: Diagnosis not present

## 2024-03-22 ENCOUNTER — Ambulatory Visit (INDEPENDENT_AMBULATORY_CARE_PROVIDER_SITE_OTHER): Admitting: Family Medicine

## 2024-03-22 ENCOUNTER — Encounter: Payer: Self-pay | Admitting: Family Medicine

## 2024-03-22 VITALS — BP 122/80 | HR 83 | Temp 98.2°F | Wt 137.0 lb

## 2024-03-22 DIAGNOSIS — R194 Change in bowel habit: Secondary | ICD-10-CM

## 2024-03-22 DIAGNOSIS — R350 Frequency of micturition: Secondary | ICD-10-CM

## 2024-03-22 DIAGNOSIS — R1032 Left lower quadrant pain: Secondary | ICD-10-CM | POA: Diagnosis not present

## 2024-03-22 LAB — POC URINALSYSI DIPSTICK (AUTOMATED)
Bilirubin, UA: NEGATIVE
Glucose, UA: NEGATIVE
Ketones, UA: NEGATIVE
Leukocytes, UA: NEGATIVE
Nitrite, UA: NEGATIVE
Protein, UA: NEGATIVE
Spec Grav, UA: >=1.03 — AB (ref 1.010–1.025)
Urobilinogen, UA: NEGATIVE U/dL — AB
pH, UA: 5.5 (ref 5.0–8.0)

## 2024-03-22 MED ORDER — AMOXICILLIN-POT CLAVULANATE 875-125 MG PO TABS: 1.0000 | ORAL_TABLET | Freq: Two times a day (BID) | ORAL | 0 refills | Status: DC

## 2024-03-22 MED ORDER — FLUCONAZOLE 150 MG PO TABS: 150.0000 mg | ORAL_TABLET | Freq: Once | ORAL | 0 refills | Status: AC

## 2024-03-22 NOTE — Patient Instructions (Addendum)
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
## 2024-03-23 LAB — URINALYSIS W MICROSCOPIC + REFLEX CULTURE
Bacteria, UA: NONE SEEN /HPF
Bilirubin Urine: NEGATIVE
Glucose, UA: NEGATIVE
Hgb urine dipstick: NEGATIVE
Hyaline Cast: NONE SEEN /LPF
Ketones, ur: NEGATIVE
Leukocyte Esterase: NEGATIVE
Nitrites, Initial: NEGATIVE
Protein, ur: NEGATIVE
RBC / HPF: NONE SEEN /HPF (ref 0–2)
Specific Gravity, Urine: 1.022 (ref 1.001–1.035)
Squamous Epithelial / HPF: NONE SEEN /HPF (ref <=5)
WBC, UA: NONE SEEN /HPF (ref 0–5)
pH: <=5 — AB (ref 5.0–8.0)

## 2024-03-23 LAB — NO CULTURE INDICATED

## 2024-03-26 ENCOUNTER — Ambulatory Visit: Admitting: Family Medicine

## 2024-03-27 ENCOUNTER — Ambulatory Visit: Admitting: Family Medicine

## 2024-03-27 ENCOUNTER — Ambulatory Visit: Payer: Self-pay | Admitting: Family Medicine

## 2024-03-27 DIAGNOSIS — R109 Unspecified abdominal pain: Secondary | ICD-10-CM | POA: Diagnosis not present

## 2024-03-27 DIAGNOSIS — R110 Nausea: Secondary | ICD-10-CM | POA: Diagnosis not present

## 2024-03-28 ENCOUNTER — Other Ambulatory Visit (HOSPITAL_BASED_OUTPATIENT_CLINIC_OR_DEPARTMENT_OTHER): Payer: Self-pay | Admitting: Student

## 2024-03-28 DIAGNOSIS — R109 Unspecified abdominal pain: Secondary | ICD-10-CM

## 2024-03-29 ENCOUNTER — Encounter: Payer: Self-pay | Admitting: Family Medicine

## 2024-03-29 ENCOUNTER — Ambulatory Visit: Admitting: Family Medicine

## 2024-03-29 ENCOUNTER — Ambulatory Visit (HOSPITAL_BASED_OUTPATIENT_CLINIC_OR_DEPARTMENT_OTHER)

## 2024-03-29 VITALS — BP 120/80 | HR 75 | Temp 98.3°F | Wt 134.0 lb

## 2024-03-29 DIAGNOSIS — E559 Vitamin D deficiency, unspecified: Secondary | ICD-10-CM | POA: Diagnosis not present

## 2024-03-29 DIAGNOSIS — Z0000 Encounter for general adult medical examination without abnormal findings: Secondary | ICD-10-CM

## 2024-03-29 DIAGNOSIS — Z23 Encounter for immunization: Secondary | ICD-10-CM | POA: Diagnosis not present

## 2024-03-29 DIAGNOSIS — Z79899 Other long term (current) drug therapy: Secondary | ICD-10-CM

## 2024-03-29 DIAGNOSIS — E782 Mixed hyperlipidemia: Secondary | ICD-10-CM | POA: Diagnosis not present

## 2024-03-29 DIAGNOSIS — B380 Acute pulmonary coccidioidomycosis: Secondary | ICD-10-CM | POA: Diagnosis not present

## 2024-03-29 DIAGNOSIS — M816 Localized osteoporosis [Lequesne]: Secondary | ICD-10-CM

## 2024-03-29 DIAGNOSIS — I2510 Atherosclerotic heart disease of native coronary artery without angina pectoris: Secondary | ICD-10-CM

## 2024-03-29 DIAGNOSIS — E663 Overweight: Secondary | ICD-10-CM

## 2024-03-29 DIAGNOSIS — Z5181 Encounter for therapeutic drug level monitoring: Secondary | ICD-10-CM

## 2024-03-29 DIAGNOSIS — F419 Anxiety disorder, unspecified: Secondary | ICD-10-CM | POA: Diagnosis not present

## 2024-03-29 DIAGNOSIS — E538 Deficiency of other specified B group vitamins: Secondary | ICD-10-CM | POA: Diagnosis not present

## 2024-03-29 DIAGNOSIS — K219 Gastro-esophageal reflux disease without esophagitis: Secondary | ICD-10-CM

## 2024-03-29 DIAGNOSIS — Z1231 Encounter for screening mammogram for malignant neoplasm of breast: Secondary | ICD-10-CM

## 2024-03-29 LAB — LIPID PANEL
Cholesterol: 233 mg/dL — ABNORMAL HIGH (ref 0–200)
HDL: 53.6 mg/dL (ref >39.00)
LDL Cholesterol: 152 mg/dL — ABNORMAL HIGH (ref 0–99)
NonHDL: 179.42
Total CHOL/HDL Ratio: 4
Triglycerides: 138 mg/dL (ref 0.0–149.0)
VLDL: 27.6 mg/dL (ref 0.0–40.0)

## 2024-03-29 LAB — COMPREHENSIVE METABOLIC PANEL WITH GFR
ALT: 10 U/L (ref 0–35)
AST: 14 U/L (ref 0–37)
Albumin: 4.4 g/dL (ref 3.5–5.2)
Alkaline Phosphatase: 72 U/L (ref 39–117)
BUN: 13 mg/dL (ref 6–23)
CO2: 29 meq/L (ref 19–32)
Calcium: 9.5 mg/dL (ref 8.4–10.5)
Chloride: 101 meq/L (ref 96–112)
Creatinine, Ser: 0.65 mg/dL (ref 0.40–1.20)
GFR: 88.67 mL/min (ref >60.00)
Glucose, Bld: 99 mg/dL (ref 70–99)
Potassium: 4.3 meq/L (ref 3.5–5.1)
Sodium: 140 meq/L (ref 135–145)
Total Bilirubin: 0.5 mg/dL (ref 0.2–1.2)
Total Protein: 6.9 g/dL (ref 6.0–8.3)

## 2024-03-29 LAB — CBC
HCT: 41.9 % (ref 36.0–46.0)
Hemoglobin: 14.2 g/dL (ref 12.0–15.0)
MCHC: 33.9 g/dL (ref 30.0–36.0)
MCV: 88.2 fl (ref 78.0–100.0)
Platelets: 224 K/uL (ref 150.0–400.0)
RBC: 4.75 Mil/uL (ref 3.87–5.11)
RDW: 13 % (ref 11.5–15.5)
WBC: 6.3 K/uL (ref 4.0–10.5)

## 2024-03-29 LAB — HEMOGLOBIN A1C: Hgb A1c MFr Bld: 6.3 % (ref 4.6–6.5)

## 2024-03-29 LAB — VITAMIN D 25 HYDROXY (VIT D DEFICIENCY, FRACTURES): VITD: 37 ng/mL (ref 30.00–100.00)

## 2024-03-29 LAB — B12 AND FOLATE PANEL
Folate: 11.2 ng/mL (ref >5.9)
Vitamin B-12: 266 pg/mL (ref 211–911)

## 2024-03-29 LAB — TSH: TSH: 1.52 u[IU]/mL (ref 0.35–5.50)

## 2024-03-29 MED ORDER — ALBUTEROL SULFATE HFA 108 (90 BASE) MCG/ACT IN AERS: 1.0000 | INHALATION_SPRAY | Freq: Four times a day (QID) | RESPIRATORY_TRACT | 11 refills | Status: AC | PRN

## 2024-03-29 MED ORDER — OMEPRAZOLE 20 MG PO CPDR: 20.0000 mg | DELAYED_RELEASE_CAPSULE | Freq: Every day | ORAL | 2 refills | Status: DC

## 2024-03-29 MED ORDER — ALENDRONATE SODIUM 70 MG PO TABS: 70.0000 mg | ORAL_TABLET | ORAL | 11 refills | Status: AC

## 2024-03-29 MED ORDER — ATORVASTATIN CALCIUM 20 MG PO TABS: 20.0000 mg | ORAL_TABLET | Freq: Every evening | ORAL | 3 refills | Status: AC

## 2024-03-29 MED ORDER — ONDANSETRON HCL 4 MG PO TABS: 4.0000 mg | ORAL_TABLET | Freq: Three times a day (TID) | ORAL | 3 refills | Status: AC | PRN

## 2024-03-29 MED ORDER — LORAZEPAM 0.5 MG PO TABS: 0.5000 mg | ORAL_TABLET | Freq: Every day | ORAL | 1 refills | Status: AC | PRN
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
## 2024-03-29 NOTE — Patient Instructions (Addendum)

## 2024-03-30 ENCOUNTER — Other Ambulatory Visit: Payer: Self-pay | Admitting: Family Medicine

## 2024-04-01 ENCOUNTER — Ambulatory Visit: Payer: Self-pay | Admitting: Family Medicine

## 2024-04-01 NOTE — Telephone Encounter (Signed)
 Refill had already been provided to patient 03/29/2024

## 2024-04-02 DIAGNOSIS — R197 Diarrhea, unspecified: Secondary | ICD-10-CM | POA: Diagnosis not present

## 2024-04-03 ENCOUNTER — Ambulatory Visit (HOSPITAL_BASED_OUTPATIENT_CLINIC_OR_DEPARTMENT_OTHER)
Admission: RE | Admit: 2024-04-03 | Discharge: 2024-04-03 | Disposition: A | Discharge status: Home / Self Care | Source: Ambulatory Visit | Attending: Student | Admitting: Student

## 2024-04-03 DIAGNOSIS — K449 Diaphragmatic hernia without obstruction or gangrene: Secondary | ICD-10-CM | POA: Diagnosis not present

## 2024-04-03 DIAGNOSIS — K5730 Diverticulosis of large intestine without perforation or abscess without bleeding: Secondary | ICD-10-CM | POA: Diagnosis not present

## 2024-04-03 DIAGNOSIS — K4090 Unilateral inguinal hernia, without obstruction or gangrene, not specified as recurrent: Secondary | ICD-10-CM | POA: Diagnosis not present

## 2024-04-03 DIAGNOSIS — R109 Unspecified abdominal pain: Secondary | ICD-10-CM | POA: Diagnosis not present

## 2024-04-03 DIAGNOSIS — N281 Cyst of kidney, acquired: Secondary | ICD-10-CM | POA: Diagnosis not present

## 2024-04-03 MED ORDER — IOHEXOL 300 MG/ML  SOLN
100.0000 mL | Freq: Once | INTRAMUSCULAR | Status: AC | PRN
  Administered 2024-04-03: 100 mL via INTRAVENOUS

## 2024-04-05 ENCOUNTER — Ambulatory Visit (INDEPENDENT_AMBULATORY_CARE_PROVIDER_SITE_OTHER): Admitting: Family Medicine

## 2024-04-05 ENCOUNTER — Encounter: Payer: Self-pay | Admitting: Family Medicine

## 2024-04-05 VITALS — BP 110/65 | HR 86 | Temp 98.8°F | Wt 134.4 lb

## 2024-04-05 DIAGNOSIS — R109 Unspecified abdominal pain: Secondary | ICD-10-CM | POA: Diagnosis not present

## 2024-04-05 DIAGNOSIS — F1320 Sedative, hypnotic or anxiolytic dependence, uncomplicated: Secondary | ICD-10-CM | POA: Diagnosis not present

## 2024-04-05 DIAGNOSIS — N281 Cyst of kidney, acquired: Secondary | ICD-10-CM

## 2024-04-05 DIAGNOSIS — K589 Irritable bowel syndrome without diarrhea: Secondary | ICD-10-CM

## 2024-04-05 NOTE — Patient Instructions (Signed)
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
## 2024-04-08 ENCOUNTER — Telehealth: Payer: Self-pay | Admitting: Urology

## 2024-04-08 ENCOUNTER — Ambulatory Visit (INDEPENDENT_AMBULATORY_CARE_PROVIDER_SITE_OTHER): Admitting: Urology

## 2024-04-08 ENCOUNTER — Encounter: Payer: Self-pay | Admitting: Urology

## 2024-04-08 VITALS — BP 115/75 | HR 84

## 2024-04-08 DIAGNOSIS — N281 Cyst of kidney, acquired: Secondary | ICD-10-CM | POA: Diagnosis not present

## 2024-04-08 DIAGNOSIS — R350 Frequency of micturition: Secondary | ICD-10-CM

## 2024-04-08 LAB — URINALYSIS, ROUTINE W REFLEX MICROSCOPIC
Bilirubin, UA: NEGATIVE
Glucose, UA: NEGATIVE
Ketones, UA: NEGATIVE
Leukocytes,UA: NEGATIVE
Nitrite, UA: NEGATIVE
Protein,UA: NEGATIVE
RBC, UA: NEGATIVE
Specific Gravity, UA: 1.03 (ref 1.005–1.030)
Urobilinogen, Ur: 0.2 mg/dL (ref 0.2–1.0)
pH, UA: 5.5 (ref 5.0–7.5)

## 2024-04-08 NOTE — Telephone Encounter (Signed)
 Wants to know if she should still see a nephrologist

## 2024-04-08 NOTE — Telephone Encounter (Signed)
 Pt state's she has have a lot of pain for the past few weeks along with left flank. Pt state's she has renal cyst that was not bothering her until now. Pt offer appointment and pt scheduled. Verbalized understanding.

## 2024-04-08 NOTE — Telephone Encounter (Signed)
 Per verbal from MD Eskridge if PCP wanted her to see a nephrologist then yes, however he sees no reason for it. Detailed vm was left
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
## 2024-04-08 NOTE — Telephone Encounter (Signed)
 Patient left message she has a cyst that is giving her discomfort. She would like to schedule a procedure with Dr. Sherrilee

## 2024-04-29 ENCOUNTER — Telehealth: Payer: Self-pay | Admitting: Urology

## 2024-04-29 DIAGNOSIS — N281 Cyst of kidney, acquired: Secondary | ICD-10-CM | POA: Diagnosis not present

## 2024-04-29 NOTE — Telephone Encounter (Signed)
 Patient called into the office today with general questions/concerns regarding wants to have virtual visit with McKenzie. Has cyst on kidney and saw Eskridge and he said the risks versus benefits are not worth removing it. She would like to talk to Dr Sherrilee about it. Patient may be reached at [phone] to discuss questions.

## 2024-04-30 ENCOUNTER — Inpatient Hospital Stay (HOSPITAL_BASED_OUTPATIENT_CLINIC_OR_DEPARTMENT_OTHER): Admission: RE | Admit: 2024-04-30 | Source: Ambulatory Visit

## 2024-05-01 ENCOUNTER — Telehealth: Payer: Self-pay

## 2024-05-01 NOTE — Telephone Encounter (Signed)
 Called pt to let her know we have scheduled her for the next available in office appt w/ MD McKenzie pt stated she did not want to wait until February to be seen pt was advised that she was put on the wait list for any cancellations and would be notified through mychart if any cancellations accord pt voiced her understanding stating she is in pain and she has a kidney mass that needs to be addressed asap pt advised MD McKenzie would be notified of CT results to review in the meantime pt voiced her understanding

## 2024-05-13 DIAGNOSIS — R110 Nausea: Secondary | ICD-10-CM | POA: Diagnosis not present

## 2024-05-13 DIAGNOSIS — R1032 Left lower quadrant pain: Secondary | ICD-10-CM | POA: Diagnosis not present

## 2024-05-13 DIAGNOSIS — R197 Diarrhea, unspecified: Secondary | ICD-10-CM | POA: Diagnosis not present

## 2024-05-13 DIAGNOSIS — Z800 Family history of malignant neoplasm of digestive organs: Secondary | ICD-10-CM | POA: Diagnosis not present

## 2024-05-13 DIAGNOSIS — K5904 Chronic idiopathic constipation: Secondary | ICD-10-CM | POA: Diagnosis not present

## 2024-05-14 NOTE — Telephone Encounter (Signed)
 Called pt to give MD McKenzie recommendation pt stated that she wants to proceed with surgery but would aldo like to speak with MD before hand pt asked how soon could she have the surgery after her 11/24 appt pt advised possibly a month or two pt stated again she believes she would like to proceed with surgery if she can speak with MD prior

## 2024-05-30 ENCOUNTER — Telehealth: Payer: Self-pay | Admitting: Nurse Practitioner

## 2024-05-30 ENCOUNTER — Ambulatory Visit: Admitting: Nurse Practitioner

## 2024-05-30 ENCOUNTER — Ambulatory Visit (INDEPENDENT_AMBULATORY_CARE_PROVIDER_SITE_OTHER)

## 2024-05-30 ENCOUNTER — Ambulatory Visit: Payer: Self-pay | Admitting: Nurse Practitioner

## 2024-05-30 ENCOUNTER — Encounter: Payer: Self-pay | Admitting: Nurse Practitioner

## 2024-05-30 VITALS — BP 120/79 | HR 100 | Ht 61.5 in | Wt 134.4 lb

## 2024-05-30 DIAGNOSIS — J45991 Cough variant asthma: Secondary | ICD-10-CM

## 2024-05-30 DIAGNOSIS — R0789 Other chest pain: Secondary | ICD-10-CM

## 2024-05-30 DIAGNOSIS — K219 Gastro-esophageal reflux disease without esophagitis: Secondary | ICD-10-CM

## 2024-05-30 DIAGNOSIS — I771 Stricture of artery: Secondary | ICD-10-CM | POA: Diagnosis not present

## 2024-05-30 DIAGNOSIS — R0609 Other forms of dyspnea: Secondary | ICD-10-CM

## 2024-05-30 DIAGNOSIS — R051 Acute cough: Secondary | ICD-10-CM

## 2024-05-30 DIAGNOSIS — R079 Chest pain, unspecified: Secondary | ICD-10-CM | POA: Diagnosis not present

## 2024-05-30 DIAGNOSIS — R058 Other specified cough: Secondary | ICD-10-CM

## 2024-05-30 DIAGNOSIS — I700 Atherosclerosis of aorta: Secondary | ICD-10-CM | POA: Diagnosis not present

## 2024-05-30 DIAGNOSIS — R059 Cough, unspecified: Secondary | ICD-10-CM | POA: Diagnosis not present

## 2024-05-30 LAB — BASIC METABOLIC PANEL WITH GFR
BUN: 13 mg/dL (ref 6–23)
CO2: 29 meq/L (ref 19–32)
Calcium: 9.7 mg/dL (ref 8.4–10.5)
Chloride: 103 meq/L (ref 96–112)
Creatinine, Ser: 0.68 mg/dL (ref 0.40–1.20)
GFR: 87.6 mL/min (ref >60.00)
Glucose, Bld: 78 mg/dL (ref 70–99)
Potassium: 4 meq/L (ref 3.5–5.1)
Sodium: 140 meq/L (ref 135–145)

## 2024-05-30 LAB — NITRIC OXIDE: Nitric Oxide: 18

## 2024-05-30 LAB — D-DIMER, QUANTITATIVE: D-Dimer, Quant: 0.34 ug{FEU}/mL (ref <0.50)

## 2024-05-30 MED ORDER — BENZONATATE 200 MG PO CAPS: 200.0000 mg | ORAL_CAPSULE | Freq: Three times a day (TID) | ORAL | 1 refills | Status: AC | PRN

## 2024-05-30 MED ORDER — OMEPRAZOLE 40 MG PO CPDR: 40.0000 mg | DELAYED_RELEASE_CAPSULE | Freq: Two times a day (BID) | ORAL | 1 refills | Status: DC

## 2024-05-30 NOTE — Patient Instructions (Addendum)
 Continue Albuterol  inhaler 2 puffs every 6 hours as needed for shortness of breath or wheezing. Notify if symptoms persist despite rescue inhaler/neb use.  -Continue flonase  1-2 sprays each nostril once daily and then astelin  nasal spray 1-2 sprays each nostril Twice daily   Increase omeprazole  to 40 mg Twice daily for the next 4-6 weeks then return to your daily 20 mg  Benzonatate  1 capsule Three times a day as needed for cough Delsym  2 tsp Twice daily as needed for cough   I am going to run a test claim with the pharmacy to see what inhaler will be covered by your plan   Chest x ray today Labs today    If the chest pain persists/doesn't improve or gets worse, go to the emergency department. Your heart rhythm test (EKG) did not show anything abnormal today    Follow up in 2 weeks with Dr. Shelah or Katie Farryn Linares,NP. If symptoms do not improve or worsen, please contact office for sooner follow up or seek emergency care.
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
## 2024-05-30 NOTE — Telephone Encounter (Signed)
 Please run test claim for ICS and/or ICS/LABA inhalers. Thanks!

## 2024-05-31 ENCOUNTER — Other Ambulatory Visit (HOSPITAL_COMMUNITY): Payer: Self-pay

## 2024-05-31 MED ORDER — ASMANEX HFA 50 MCG/ACT IN AERO
2.0000 | INHALATION_SPRAY | Freq: Two times a day (BID) | RESPIRATORY_TRACT | 5 refills | Status: DC | PRN
Start: 2024-05-31 — End: 2024-06-10

## 2024-05-31 NOTE — Telephone Encounter (Signed)
 Please let pt know about deductible and cost of inhalers as a result. I sent in Asmanex HFA 2 puffs Twice daily, which appears to be the cheapest. I want her using this every day until symptoms resolve, continue for 1-2 weeks after then can use as needed for shortness of breath, wheezing, cough. Clean mouth out well afterwards to reduce risk of thrush. Side effect profile of ICS is same as Qvar - Increase risk of infections with ICS use in certain patient populations. Unlikely in her case and benefit outweighs risk but as always, will monitor closely. Thanks!

## 2024-05-31 NOTE — Telephone Encounter (Signed)
 I called and spoke with the pt and notified of recommendations from [facility]. She verbalized understanding. I will send to her via mychart also per her request. Nothing further needed.

## 2024-06-04 DIAGNOSIS — K080 Exfoliation of teeth due to systemic causes: Secondary | ICD-10-CM | POA: Diagnosis not present

## 2024-06-04 NOTE — Telephone Encounter (Signed)
 I called and spoke with the pharmacy at [facility]  Asmanex is on back order and they are suggesting we call in something else since unsure when they will have it  Izetta- can you please advise, thanks!  Per pharm team regarding inhalers  Covered options at this time:    ICS Asmanex Twisthaler- $111.98 Asmanex HFA- $95.38 Qvar  Redihaler- $204.18 Fluticasone  HFA- $217.46 Arnuity Ellipta- $195.22   ICS/LABA Wixela- $217.46 Brand Advair HFA- $217.46 Generic Symbicort- $217.46 Dulera- $217.46 Brand Breo Ellipta- $217.48   *patient has a deductible to meet causing these higher prices.

## 2024-06-05 ENCOUNTER — Ambulatory Visit

## 2024-06-05 VITALS — Ht 61.5 in | Wt 134.0 lb

## 2024-06-05 DIAGNOSIS — Z0000 Encounter for general adult medical examination without abnormal findings: Secondary | ICD-10-CM | POA: Diagnosis not present

## 2024-06-05 NOTE — Telephone Encounter (Signed)
 Everything else is going to be almost $100 more. I would recommend finding alternative pharmacy. Please call [facility] pharmacy and see if they have it in stock. If not, have pt call her local [facility] and [facility] to see if they have it in stock and update us . Thanks.

## 2024-06-05 NOTE — Patient Instructions (Addendum)
 Crystal Horn,  Thank you for taking the time for your Medicare Wellness Visit. I appreciate your continued commitment to your health goals. Please review the care plan we discussed, and feel free to reach out if I can assist you further.  Please note that Annual Wellness Visits do not include a physical exam. Some assessments may be limited, especially if the visit was conducted virtually. If needed, we may recommend an in-person follow-up with your provider.  Ongoing Care Seeing your primary care provider every 3 to 6 months helps us  monitor your health and provide consistent, personalized care. Next office visit on 09/27/2024.  Aim for 30 minutes of exercise or brisk walking, 6-8 glasses of water, and 5 servings of fruits and vegetables each day.   Referrals If a referral was made during today's visit and you haven't received any updates within two weeks, please contact the referred provider directly to check on the status.  Recommended Screenings:  Health Maintenance  Topic Date Due   DEXA scan (bone density measurement)  09/15/2024   Breast Cancer Screening  10/09/2024   Medicare Annual Wellness Visit  06/05/2025   Colon Cancer Screening  08/18/2027   DTaP/Tdap/Td vaccine (2 - Td or Tdap) 03/07/2033   Pneumococcal Vaccine for age over 74  Completed   Flu Shot  Completed   Hepatitis C Screening  Completed   Meningitis B Vaccine  Aged Out   COVID-19 Vaccine  Discontinued   Zoster (Shingles) Vaccine  Discontinued       06/05/2024   10:16 AM  Advanced Directives  Does Patient Have a Medical Advance Directive? Yes  Type of Estate Agent of [place];Living will    Vision: Annual vision screenings are recommended for early detection of glaucoma, cataracts, and diabetic retinopathy. These exams can also reveal signs of chronic conditions such as diabetes and high blood pressure.  Dental: Annual dental screenings help detect early signs of oral cancer, gum  disease, and other conditions linked to overall health, including heart disease and diabetes.  Please see the attached documents for additional preventive care recommendations.
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
## 2024-06-10 ENCOUNTER — Other Ambulatory Visit (HOSPITAL_COMMUNITY): Payer: Self-pay

## 2024-06-10 MED ORDER — ASMANEX HFA 100 MCG/ACT IN AERO
2.0000 | INHALATION_SPRAY | Freq: Two times a day (BID) | RESPIRATORY_TRACT | 11 refills | Status: AC
  Filled 2024-06-10: qty 13, 30d supply, fill #0

## 2024-06-10 NOTE — Addendum Note (Signed)
 Addended by: Toussaint Golson V on: 06/10/2024 10:23 AM   Modules accepted: Orders

## 2024-06-10 NOTE — Telephone Encounter (Signed)
 I called [facility]- they have the 100 mcg dose of Asmanex in stock. The 50 mcg is on backorder until at least December.

## 2024-06-10 NOTE — Telephone Encounter (Signed)
 Sent Asmanex 100 mcg  2 puffs Twice daily. Brush tongue and rinse mouth afterwards. [facility]

## 2024-06-14 ENCOUNTER — Encounter: Payer: Self-pay | Admitting: Nurse Practitioner

## 2024-06-14 ENCOUNTER — Telehealth: Admitting: Nurse Practitioner

## 2024-06-14 DIAGNOSIS — J45991 Cough variant asthma: Secondary | ICD-10-CM

## 2024-06-14 DIAGNOSIS — R002 Palpitations: Secondary | ICD-10-CM | POA: Diagnosis not present

## 2024-06-14 DIAGNOSIS — K219 Gastro-esophageal reflux disease without esophagitis: Secondary | ICD-10-CM | POA: Diagnosis not present

## 2024-06-14 NOTE — Patient Instructions (Addendum)
 Continue Albuterol  inhaler 2 puffs every 6 hours as needed for shortness of breath or wheezing. Notify if symptoms persist despite rescue inhaler/neb use.  -Continue flonase  1-2 sprays each nostril once daily and then astelin  nasal spray 1-2 sprays each nostril Twice daily  - Continue omeprazole  to 40 mg daily  - Continue Benzonatate  1 capsule Three times a day as needed for cough - Continue Asmanex  2 puffs 1-2 times a day. Brush tongue and rinse mouth afterwards   Follow up in 4 months with Dr. Shelah or APP. If symptoms do not improve or worsen, please contact office for sooner follow up or seek emergency care.
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
## 2024-06-17 ENCOUNTER — Encounter: Payer: Self-pay | Admitting: Urology

## 2024-06-17 ENCOUNTER — Ambulatory Visit: Admitting: Urology

## 2024-06-17 VITALS — BP 118/73 | HR 94

## 2024-06-17 DIAGNOSIS — N281 Cyst of kidney, acquired: Secondary | ICD-10-CM | POA: Diagnosis not present

## 2024-06-17 LAB — URINALYSIS, ROUTINE W REFLEX MICROSCOPIC
Bilirubin, UA: NEGATIVE
Glucose, UA: NEGATIVE
Ketones, UA: NEGATIVE
Leukocytes,UA: NEGATIVE
Nitrite, UA: NEGATIVE
Protein,UA: NEGATIVE
RBC, UA: NEGATIVE
Specific Gravity, UA: <1.005 — ABNORMAL LOW (ref 1.005–1.030)
Urobilinogen, Ur: 0.2 mg/dL (ref 0.2–1.0)
pH, UA: 6.5 (ref 5.0–7.5)
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
## 2024-06-17 NOTE — Patient Instructions (Signed)
 A Growth in the Kidney (Renal Mass): What to Know  A renal mass is an abnormal growth in the kidney. It may be found during an MRI, CT scan, or ultrasound that's done to check for other problems in the belly. Some renal masses are cancerous, or malignant, and can grow or spread quickly. Others are benign, which means they're not cancer. Renal masses include: Tumors. These may be either cancerous or benign. The most common cancerous tumor in adults is called renal cell carcinoma. In children, the most common type is Wilms tumor. The most common kidney tumors that aren't cancer include renal adenomas, oncocytomas, and angiomyolipomas (AML). Cysts. These are pockets of fluid that form on or in the kidney. What are the causes? Certain types of cancers, infections, or injuries can cause a renal mass. It's not always known what causes a cyst to form in or on the kidney. What are the signs or symptoms? Often, a renal mass or kidney cyst doesn't cause any signs or symptoms. How is this diagnosed? Your health care provider may suggest tests to diagnose the cause of your renal mass. These tests may include: A physical exam. Blood tests. Pee (urine) tests. Imaging tests, such as: CT scan. MRI. Ultrasound. Chest X-ray or bone scan. These may be done if a tumor is cancerous to see if the cancer has spread outside the kidney. Biopsy. This is when a small piece of tissue is removed from the renal mass for testing. How is this treated? Treatment is not always needed for a renal mass. Treatment will depend on the cause of the mass and if it's causing any problems or symptoms. For a cancerous renal mass, treatment options may include: Surgery. This is done to remove the tumor and any affected tissue. Chemotherapy. This uses medicines to kill cancer cells. Radiation. High-energy X-rays or gamma rays are used to kill cancer cells. Ablation. This uses extreme hot or cold temperature to kill the cancer  cells. Immunotherapy. Medicines are used to help the body's defense system (immune system) fight the cancer cells. Taking part in clinical trials. This involves trying new or experimental treatments to see if they're effective. Most kidney cysts don't need to be treated. Follow these instructions at home: What you need to do at home will depend on the cause of the mass. Follow the instructions that your provider gives you. In general: Take medicines only as told. If you were given antibiotics, take them as told. Do not stop taking them even if you start to feel better. Follow any instructions from your provider about what you can and can't do. Do not smoke, vape, or use nicotine or tobacco. Keep all follow-up visits. Your provider will need to check if your renal mass has changed or grown. Contact a health care provider if: You have flank pain, which is pain in your side or back. You have a fever. You have a loss of appetite. You have pain or swelling in your belly. You lose weight. Get help right away if: Your pain gets worse. There's blood in your pee. You can't pee. This information is not intended to replace advice given to you by your health care provider. Make sure you discuss any questions you have with your health care provider. Document Revised: 01/06/2023 Document Reviewed: 01/06/2023 Elsevier Patient Education  2025 [employer].

## 2024-06-17 NOTE — Progress Notes (Unsigned)
 Johann Sieving, MD  Baldwin Channing CROME [place]  US   L renal cyst aspiration -- decompress completely  DDH       Previous Messages    ----- Message ----- From: Baldwin Channing CROME Sent: 06/17/2024  11:58 AM EST To: Channing CROME Baldwin; Taryn F Rigney, RT; Ir Proc* Subject: CT GUIDED NEEDLE PLACEMENT                    Procedure :CT GUIDED NEEDLE PLACEMENT  Reason :left renal cyst Dx: Renal cyst, left [N28.1 (ICD-10-CM)]    History :DG Chest 2 View,CT ABDOMEN PELVIS W CONTRAST,Ultrasound renal complete  Provider: Sherrilee Belvie CROME, MD  Provider contact ;  [phone]

## 2024-07-11 NOTE — Progress Notes (Signed)
AVS printed and given to patient

## 2024-07-11 NOTE — Progress Notes (Signed)
 "  [address] [place] [address] [phone]  Follow-up   Subjective   Patient ID:  Crystal Horn is a 73 y.o. (DOB July 18, 1951) female.   CC: had concerns including Abdominal Pain (Follow up).   HPI: 73 yo female with PMH of HLD, osteoporosis presents today for follow up for abdominal cramping. Background:  Chart review revealed she underwent CT scan of her A/P with contrast on 04/03/24 which was notable for sigmoid diverticulosis and an 8.5cm left renal cyst. CBC, CMP, HbA1c & TSH from 03/29/24 were normal.  Stool testing with [facility]fire was negative.  Last colonoscopy was in 07/2022 at which point random biopsies were negative. She was previously following with [facility].  Avri reported that her bowels have been erratic for as long as she can recall, but starting ~3 months prior to her consult, she had been suffering from chronic, episodic unexplained nausea, which occurs just about daily, in addition to episodic abdominal cramping, which tends to be in the lower left abdomen.  KUB suggested moderate fecal retention hence I had her purge and start daily Miralax.  I also provided her Levsin to be used as needed for cramping.   She reports today she has seen significant improvement since her last visit.  Her nausea spells went from just about daily to ~once weekly now.  She really struggled after completing the bowel purge, but since that time she has been having BM's most days and uses the Levsin orn, which helps her cramping significantly.  Denies any GI bleeding, fever, vomiting or weight loss.  Looks great today in the office.   Review of Systems:  Except as stated in the HPI, all other systems reviewed and are negative.   History reviewed. No pertinent past medical history.  Past Surgical History:  Procedure Laterality Date   Colonoscopy     2024    Social History[1]  Family History[2]  Medications: Medications Taking[3]   Allergies[4]   Objective    BP 120/76 (BP Location: Left Upper Arm, Patient Position: Sitting)   Pulse 90   Temp 98.7 F (37.1 C) (Oral)   Ht 5' 1.5 (1.562 m)   Wt 133 lb (60.3 kg)   BMI 24.72 kg/m    General Appearance:  Alert, cooperative, no distress, appears stated age  Head:  Normocephalic, without obvious abnormality, atraumatic  Eyes:  PERRL, conjunctiva/corneas clear  Nose: Nares normal, mucosa normal  Throat: Lips, mucosa, and tongue normal; teeth and gums normal  Neck: Supple, symmetrical, trachea midline, no adenopathy; thyroid  without tenderness/mass/nodules; no JVD  Lungs:   Clear to auscultation bilaterally, respirations unlabored, excursion symmetrical  Breasts:  Deferred  Heart:  Regular rate and rhythm, S1 and S2 normal, no murmur, rub, or gallop  Abdomen:   Soft, non-tender, bowel sounds normoactive, no masses, no organomegaly  Pelvic: Deferred  Extremities: Extremities normal, atraumatic, no cyanosis or edema, pulses 2+ symmetrically  Skin: Skin color, texture, turgor normal, no rashes or lesions  Lymph nodes: No cervical, supraclavicular, and axillary adenopathy  Neurologic: Alert, oriented x3, nonfocal exam      Assessment   1. Abdominal cramping   2. Nausea   3. Chronic idiopathic constipation   4. Family history of colon cancer      Plan  -Continue current medications.  Modified Medications   Modified Medication Previous Medication   HYOSCYAMINE SULFATE SL (LEVSIN/SL) 0.125 MG SUBL Hyoscyamine Sulfate SL (LEVSIN/SL) 0.125 MG SUBL      Use  1-2 tablets SL every 4 hours as needed    Use 1-2 tablets SL every 4 hours as needed    Follow up in about 4 months (around 11/09/2024). Patient Instructions  Trinitey appears to have seen significant improvement since her last visit, but still is suffering from episodic crampy abdominal pain and nausea at times.  She has Zofran  at home that helps with her nausea and the Levsin is working for her cramping.  We discussed keeping a close diary  of symptoms between now and her next visit, and if her nausea persists, we will consider EGD.    Colorectal cancer recall due: 07/2027   Electronically Signed: Carlin SHAUNNA Delude, MD 07/11/2024 2:11 PM                [1] Social History Socioeconomic History   Marital status: Married  Tobacco Use   Smoking status: Never    Passive exposure: Never   Smokeless tobacco: Never  Vaping Use   Vaping status: Never Used  Substance and Sexual Activity   Alcohol use: Never   Drug use: Yes    Comment: occasional gummy  [2] Family History Problem Relation Name Age of Onset   Colon cancer Sister     Colon polyps Sister    [3] Outpatient Medications Marked as Taking for the 07/11/24 encounter (Office Visit) with Carlin SHAUNNA Delude, MD  Medication Sig Dispense Refill   albuterol  sulfate HFA (PROVENTIL ,VENTOLIN ,PROAIR ) 108 (90 Base) MCG/ACT inhaler Inhale one puff to two puffs into the lungs every 6 (six) hours as needed.     albuterol  sulfate HFA (PROVENTIL ,VENTOLIN ,PROAIR ) 108 (90 Base) MCG/ACT inhaler one puff every 4 (four) hours.     alendronate  (FOSAMAX ) 70 mg tablet Take one tablet (70 mg dose) by mouth.     ASMANEX  HFA 100 MCG/ACT AERO inhaler Inhale two puffs into the lungs 2 (two) times daily.     atorvastatin  (LIPITOR) 20 mg tablet Take one tablet (20 mg dose) by mouth every evening.     Cholecalciferol (VITAMIN D3) 50 mcg (2000 UT) TABS tablet Take by mouth.     fluticasone  (FLOVENT  HFA) 44 mcg/actuation inhaler one puff.     fluticasone  propionate (FLONASE ) 50 mcg/actuation nasal spray two sprays by Nasal route daily.     [DISCONTINUED] Hyoscyamine Sulfate SL (LEVSIN/SL) 0.125 MG SUBL Use 1-2 tablets SL every 4 hours as needed 30 tablet 3   loratadine  (CLARITIN ) 10 MG tablet Take one tablet (10 mg dose) by mouth daily.     LORAzepam  (ATIVAN ) 0.5 mg tablet Take one tablet (0.5 mg dose) by mouth daily as needed.     omeprazole  (PRILOSEC) 20 mg capsule  Take one capsule (20 mg dose) by mouth daily. (Patient taking differently: Take two capsules (40 mg dose) by mouth daily.)     ondansetron  (ZOFRAN ) 4 mg tablet Take one tablet (4 mg dose) by mouth every 8 (eight) hours as needed.     ondansetron  (ZOFRAN -ODT) 4 mg disintegrating tablet 1 tablet on the tongue and allow to dissolve Orally Once a day    [4] Allergies Allergen Reactions   Sulfa Antibiotics Rash  "

## 2024-07-12 ENCOUNTER — Telehealth: Payer: Self-pay

## 2024-07-12 ENCOUNTER — Other Ambulatory Visit: Payer: Self-pay

## 2024-07-12 DIAGNOSIS — R0789 Other chest pain: Secondary | ICD-10-CM

## 2024-07-12 NOTE — Telephone Encounter (Signed)
 Patient left a voicemail requesting prior authorization for her procedure with IR on 07/15/2024.  After reviewing referral I informed her that precert was not required with her [facility].  She states she spoke with so many people yesterday she was unsure if she was getting the correct information.  Patient states she already has her out of pocket cost for her procedure so she did not understand why she was told she still needs to get this authorized.  I confirmed that there was no authorization needed and that our office did not receive a call or notice from precert that authorization was missing.  She wanted to confrim the time and location of her procedure, per appt notes I informed her to Arrive at [facility] at 1230 for her 1pm appt.  Patient voiced understanding.

## 2024-07-15 ENCOUNTER — Ambulatory Visit (HOSPITAL_COMMUNITY)
Admission: RE | Admit: 2024-07-15 | Discharge: 2024-07-15 | Disposition: A | Discharge status: Home / Self Care | Source: Ambulatory Visit | Attending: Urology | Admitting: Urology

## 2024-07-15 ENCOUNTER — Telehealth: Payer: Self-pay

## 2024-07-15 VITALS — BP 128/78

## 2024-07-15 DIAGNOSIS — N281 Cyst of kidney, acquired: Secondary | ICD-10-CM | POA: Diagnosis present

## 2024-07-15 DIAGNOSIS — R10A2 Flank pain, left side: Secondary | ICD-10-CM | POA: Insufficient documentation

## 2024-07-15 MED ORDER — LIDOCAINE HCL 1 % IJ SOLN
INTRAMUSCULAR | Status: AC
  Filled 2024-07-15: qty 20

## 2024-07-15 NOTE — Telephone Encounter (Signed)
 Tried calling pt with no answer. LVM for return call to office.

## 2024-07-15 NOTE — Telephone Encounter (Signed)
 Patient return call. Pt is advised to contact [facility] for information on her procedure for the aspiration of her kidney cyst.

## 2024-07-15 NOTE — Procedures (Signed)
" °  Procedure:  US  aspiration L renal cyst pale yellow clear fluid Preprocedure diagnosis: LLQ pain, renal cyst Postprocedure diagnosis: same EBL:    minimal Complications:   none immediate  See full dictation in [employer].  CHARM Toribio Faes MD Main # [phone] Pager  [phone] Mobile [phone]    "

## 2024-07-17 LAB — CYTOLOGY - NON PAP

## 2024-07-21 ENCOUNTER — Telehealth: Payer: Self-pay

## 2024-07-21 NOTE — Telephone Encounter (Signed)
 SABRA

## 2024-07-22 NOTE — Telephone Encounter (Signed)
 Tried calling pt with no answer. LVM for return call to office.

## 2024-07-23 ENCOUNTER — Telehealth: Payer: Self-pay

## 2024-07-23 NOTE — Telephone Encounter (Signed)
 Patient call in today for a sooner appointment. Pt had a renal cyst aspiration and  she state's she is having left flank pain and  lower back pain.

## 2024-07-25 ENCOUNTER — Other Ambulatory Visit: Payer: Self-pay | Admitting: Nurse Practitioner

## 2024-07-25 DIAGNOSIS — K219 Gastro-esophageal reflux disease without esophagitis: Secondary | ICD-10-CM

## 2024-07-25 DIAGNOSIS — R0789 Other chest pain: Secondary | ICD-10-CM

## 2024-07-31 ENCOUNTER — Encounter: Payer: Self-pay | Admitting: Urology

## 2024-07-31 ENCOUNTER — Ambulatory Visit: Admitting: Urology

## 2024-07-31 VITALS — BP 109/60 | HR 73

## 2024-07-31 DIAGNOSIS — N281 Cyst of kidney, acquired: Secondary | ICD-10-CM

## 2024-07-31 NOTE — Patient Instructions (Signed)
 A Growth in the Kidney (Renal Mass): What to Know  A renal mass is an abnormal growth in the kidney. It may be found during an MRI, CT scan, or ultrasound that's done to check for other problems in the belly. Some renal masses are cancerous, or malignant, and can grow or spread quickly. Others are benign, which means they're not cancer. Renal masses include: Tumors. These may be either cancerous or benign. The most common cancerous tumor in adults is called renal cell carcinoma. In children, the most common type is Wilms tumor. The most common kidney tumors that aren't cancer include renal adenomas, oncocytomas, and angiomyolipomas (AML). Cysts. These are pockets of fluid that form on or in the kidney. What are the causes? Certain types of cancers, infections, or injuries can cause a renal mass. It's not always known what causes a cyst to form in or on the kidney. What are the signs or symptoms? Often, a renal mass or kidney cyst doesn't cause any signs or symptoms. How is this diagnosed? Your health care provider may suggest tests to diagnose the cause of your renal mass. These tests may include: A physical exam. Blood tests. Pee (urine) tests. Imaging tests, such as: CT scan. MRI. Ultrasound. Chest X-ray or bone scan. These may be done if a tumor is cancerous to see if the cancer has spread outside the kidney. Biopsy. This is when a small piece of tissue is removed from the renal mass for testing. How is this treated? Treatment is not always needed for a renal mass. Treatment will depend on the cause of the mass and if it's causing any problems or symptoms. For a cancerous renal mass, treatment options may include: Surgery. This is done to remove the tumor and any affected tissue. Chemotherapy. This uses medicines to kill cancer cells. Radiation. High-energy X-rays or gamma rays are used to kill cancer cells. Ablation. This uses extreme hot or cold temperature to kill the cancer  cells. Immunotherapy. Medicines are used to help the body's defense system (immune system) fight the cancer cells. Taking part in clinical trials. This involves trying new or experimental treatments to see if they're effective. Most kidney cysts don't need to be treated. Follow these instructions at home: What you need to do at home will depend on the cause of the mass. Follow the instructions that your provider gives you. In general: Take medicines only as told. If you were given antibiotics, take them as told. Do not stop taking them even if you start to feel better. Follow any instructions from your provider about what you can and can't do. Do not smoke, vape, or use nicotine or tobacco. Keep all follow-up visits. Your provider will need to check if your renal mass has changed or grown. Contact a health care provider if: You have flank pain, which is pain in your side or back. You have a fever. You have a loss of appetite. You have pain or swelling in your belly. You lose weight. Get help right away if: Your pain gets worse. There's blood in your pee. You can't pee. This information is not intended to replace advice given to you by your health care provider. Make sure you discuss any questions you have with your health care provider. Document Revised: 01/06/2023 Document Reviewed: 01/06/2023 Elsevier Patient Education  2025 [employer].
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
## 2024-08-05 ENCOUNTER — Telehealth: Payer: Self-pay

## 2024-08-05 NOTE — Telephone Encounter (Signed)
 Pt called in about having Renal US  and state's Dr. Sherrilee want her to have this done sooner. Verbal from Dr. Sherrilee pt do not need a stat Renal U/S. Pt voiced understanding

## 2024-08-06 ENCOUNTER — Encounter: Payer: Self-pay | Admitting: Urology

## 2024-08-21 ENCOUNTER — Ambulatory Visit: Admitting: Urology

## 2024-09-04 ENCOUNTER — Ambulatory Visit: Admitting: Urology

## 2024-09-27 ENCOUNTER — Ambulatory Visit: Admitting: Family Medicine

## 2024-12-30 ENCOUNTER — Other Ambulatory Visit (HOSPITAL_BASED_OUTPATIENT_CLINIC_OR_DEPARTMENT_OTHER)

## 2025-01-03 ENCOUNTER — Ambulatory Visit: Admitting: Urology

## 2025-01-17 ENCOUNTER — Other Ambulatory Visit (HOSPITAL_COMMUNITY)

## 2025-02-07 ENCOUNTER — Ambulatory Visit: Admitting: Urology
